# Patient Record
Sex: Male | Born: 1995
Health system: Southern US, Community
[De-identification: ages and names within clinical notes are randomized; demographics above are authoritative.]

## PROBLEM LIST (undated history)

## (undated) DIAGNOSIS — F419 Anxiety disorder, unspecified: Secondary | ICD-10-CM

## (undated) HISTORY — DX: Anxiety disorder, unspecified: F41.9

---

## 2017-07-06 ENCOUNTER — Inpatient Hospital Stay (HOSPITAL_COMMUNITY)
Admission: EM | Admit: 2017-07-06 | Discharge: 2017-07-08 | DRG: 076 | Discharge status: Against Medical Advice | Payer: BLUE CROSS/BLUE SHIELD | Attending: Internal Medicine | Admitting: Internal Medicine

## 2017-07-06 ENCOUNTER — Emergency Department (HOSPITAL_COMMUNITY): Payer: BLUE CROSS/BLUE SHIELD

## 2017-07-06 ENCOUNTER — Encounter (HOSPITAL_COMMUNITY): Payer: Self-pay | Admitting: Emergency Medicine

## 2017-07-06 ENCOUNTER — Other Ambulatory Visit: Payer: Self-pay

## 2017-07-06 DIAGNOSIS — A879 Viral meningitis, unspecified: Secondary | ICD-10-CM | POA: Diagnosis not present

## 2017-07-06 DIAGNOSIS — G039 Meningitis, unspecified: Secondary | ICD-10-CM | POA: Diagnosis not present

## 2017-07-06 DIAGNOSIS — Z87891 Personal history of nicotine dependence: Secondary | ICD-10-CM

## 2017-07-06 DIAGNOSIS — R112 Nausea with vomiting, unspecified: Secondary | ICD-10-CM | POA: Diagnosis not present

## 2017-07-06 DIAGNOSIS — R001 Bradycardia, unspecified: Secondary | ICD-10-CM | POA: Diagnosis present

## 2017-07-06 DIAGNOSIS — M542 Cervicalgia: Secondary | ICD-10-CM | POA: Diagnosis not present

## 2017-07-06 DIAGNOSIS — R079 Chest pain, unspecified: Secondary | ICD-10-CM | POA: Diagnosis not present

## 2017-07-06 DIAGNOSIS — R51 Headache: Secondary | ICD-10-CM | POA: Diagnosis not present

## 2017-07-06 DIAGNOSIS — M545 Low back pain: Secondary | ICD-10-CM | POA: Diagnosis not present

## 2017-07-06 LAB — CBC WITH DIFFERENTIAL/PLATELET
BASOS PCT: 1 %
Basophils Absolute: 0 10*3/uL (ref 0.0–0.1)
Eosinophils Absolute: 0.1 10*3/uL (ref 0.0–0.7)
Eosinophils Relative: 2 %
HEMATOCRIT: 43.5 % (ref 39.0–52.0)
Hemoglobin: 14.6 g/dL (ref 13.0–17.0)
Lymphocytes Relative: 23 %
Lymphs Abs: 1.4 10*3/uL (ref 0.7–4.0)
MCH: 29.7 pg (ref 26.0–34.0)
MCHC: 33.6 g/dL (ref 30.0–36.0)
MCV: 88.4 fL (ref 78.0–100.0)
MONO ABS: 0.4 10*3/uL (ref 0.1–1.0)
MONOS PCT: 7 %
NEUTROS ABS: 4.1 10*3/uL (ref 1.7–7.7)
Neutrophils Relative %: 67 %
Platelets: 214 10*3/uL (ref 150–400)
RBC: 4.92 MIL/uL (ref 4.22–5.81)
RDW: 12 % (ref 11.5–15.5)
WBC: 6 10*3/uL (ref 4.0–10.5)

## 2017-07-06 LAB — CSF CELL COUNT WITH DIFFERENTIAL
Eosinophils, CSF: 0 % (ref 0–1)
Eosinophils, CSF: 1 % (ref 0–1)
Lymphs, CSF: 73 % (ref 40–80)
Lymphs, CSF: 84 % — ABNORMAL HIGH (ref 40–80)
Monocyte-Macrophage-Spinal Fluid: 11 % — ABNORMAL LOW (ref 15–45)
Monocyte-Macrophage-Spinal Fluid: 14 % — ABNORMAL LOW (ref 15–45)
Other Cells, CSF: 3
RBC Count, CSF: 243 /mm3 — ABNORMAL HIGH
RBC Count, CSF: 293 /mm3 — ABNORMAL HIGH
Segmented Neutrophils-CSF: 3 % (ref 0–6)
Segmented Neutrophils-CSF: 5 % (ref 0–6)
Tube #: 1
Tube #: 4
WBC, CSF: 158 /mm3 (ref 0–5)
WBC, CSF: 233 /mm3 (ref 0–5)

## 2017-07-06 LAB — BASIC METABOLIC PANEL
Anion gap: 12 (ref 5–15)
BUN: 12 mg/dL (ref 6–20)
CALCIUM: 9.9 mg/dL (ref 8.9–10.3)
CO2: 24 mmol/L (ref 22–32)
CREATININE: 1.33 mg/dL — AB (ref 0.61–1.24)
Chloride: 103 mmol/L (ref 101–111)
GFR calc Af Amer: >60 mL/min (ref >60)
GFR calc non Af Amer: >60 mL/min (ref >60)
Glucose, Bld: 91 mg/dL (ref 65–99)
Potassium: 3.7 mmol/L (ref 3.5–5.1)
Sodium: 139 mmol/L (ref 135–145)

## 2017-07-06 LAB — CK: Total CK: 338 U/L (ref 49–397)

## 2017-07-06 LAB — PROTEIN AND GLUCOSE, CSF
Glucose, CSF: 50 mg/dL (ref 40–70)
Total  Protein, CSF: 68 mg/dL — ABNORMAL HIGH (ref 15–45)

## 2017-07-06 MED ORDER — ACYCLOVIR SODIUM 50 MG/ML IV SOLN
INTRAVENOUS | Status: AC
  Filled 2017-07-06: qty 10

## 2017-07-06 MED ORDER — HYDROMORPHONE HCL 1 MG/ML IJ SOLN
1.0000 mg | Freq: Once | INTRAMUSCULAR | Status: AC
  Administered 2017-07-06: 1 mg via INTRAVENOUS
  Filled 2017-07-06: qty 1

## 2017-07-06 MED ORDER — KETOROLAC TROMETHAMINE 30 MG/ML IJ SOLN
15.0000 mg | Freq: Once | INTRAMUSCULAR | Status: DC
  Filled 2017-07-06: qty 1

## 2017-07-06 MED ORDER — SODIUM CHLORIDE 0.9 % IV SOLN: 1.0000 g | Freq: Once | INTRAVENOUS | Status: DC

## 2017-07-06 MED ORDER — HYDROMORPHONE HCL 1 MG/ML IJ SOLN
0.5000 mg | Freq: Once | INTRAMUSCULAR | Status: AC
  Administered 2017-07-06: 0.5 mg via INTRAVENOUS
  Filled 2017-07-06: qty 1

## 2017-07-06 MED ORDER — LIDOCAINE HCL (PF) 1 % IJ SOLN
5.0000 mL | Freq: Once | INTRAMUSCULAR | Status: DC
  Filled 2017-07-06: qty 6

## 2017-07-06 MED ORDER — SODIUM CHLORIDE 0.9 % IV SOLN
2.0000 g | Freq: Once | INTRAVENOUS | Status: AC
  Administered 2017-07-07: 2 g via INTRAVENOUS
  Filled 2017-07-06: qty 20

## 2017-07-06 MED ORDER — METOCLOPRAMIDE HCL 5 MG/ML IJ SOLN
10.0000 mg | Freq: Once | INTRAMUSCULAR | Status: AC
  Administered 2017-07-06: 10 mg via INTRAVENOUS
  Filled 2017-07-06: qty 2

## 2017-07-06 MED ORDER — VANCOMYCIN HCL IN DEXTROSE 1-5 GM/200ML-% IV SOLN
1000.0000 mg | Freq: Once | INTRAVENOUS | Status: AC
  Administered 2017-07-06: 1000 mg via INTRAVENOUS
  Filled 2017-07-06: qty 200

## 2017-07-06 MED ORDER — ACYCLOVIR SODIUM 50 MG/ML IV SOLN
10.0000 mg/kg | Freq: Once | INTRAVENOUS | Status: AC
  Administered 2017-07-07: 730 mg via INTRAVENOUS
  Filled 2017-07-06: qty 14.6

## 2017-07-06 MED ORDER — SODIUM CHLORIDE 0.9 % IV BOLUS
1000.0000 mL | Freq: Once | INTRAVENOUS | Status: AC
Start: 2017-07-06 — End: 2017-07-06
  Administered 2017-07-06: 1000 mL via INTRAVENOUS

## 2017-07-06 MED ORDER — LORAZEPAM 2 MG/ML IJ SOLN
1.0000 mg | Freq: Once | INTRAMUSCULAR | Status: AC
  Administered 2017-07-06: 1 mg via INTRAVENOUS
  Filled 2017-07-06: qty 1

## 2017-07-06 NOTE — ED Provider Notes (Addendum)
Medical screening examination/treatment/procedure(s) were conducted as a shared visit with non-physician practitioner(s) and myself.  I personally evaluated the patient during the encounter.  None  21yM with HA and neck/back pain. Concerned he has meningitis. Nuchal rigidity on exam. Photophobia. Neuro exam nonfocal. Says woke-up with HA` and progressing through out day. Not thunderclap. Will CT but suspicion for [facility] fairly low. Labs, LP, symptomatic tx.   Abnormal LP results. Could be traumatic tap. WBC count is high relative to the amount of RBC present though which is concerning and counts didn't change much between tubes #1 and #4.   I suspect this is viral meningitis.  Consider SAH, but I still have a low suspicion. CT head normal. Done after 6 hours of symptom onset but CT is still very sensitive within 24 hours. RBC counts in [facility] typically much, much higher (1,000-100,000+).  Will cover for both HSV and bacterial meningitis at this point until have culture/PCR data.   .Lumbar Puncture Date/Time: 07/06/2017 9:00 PM Performed by: Virgel Manifold, MD Authorized by: Virgel Manifold, MD   Consent:    Consent obtained:  Verbal and emergent situation   Consent given by:  Patient   Risks discussed:  Bleeding, headache, pain and infection Pre-procedure details:    Procedure purpose:  Diagnostic   Preparation: Patient was prepped and draped in usual sterile fashion   Anesthesia (see MAR for exact dosages):    Anesthesia method:  Local infiltration   Local anesthetic:  Lidocaine 1% w/o epi Procedure details:    Lumbar space:  L3-L4 interspace   Patient position:  Sitting   Needle gauge:  20   Needle type:  Spinal needle - Quincke tip   Needle length (in):  3.5   Ultrasound guidance: no     Number of attempts:  1 (one attempt by myself after attempt by Evalee Jefferson, PA)   Tubes of fluid:  4   Total volume (ml):  5 Post-procedure:    Puncture site:  Adhesive bandage applied   Patient  tolerance of procedure:  Tolerated well, no immediate complications       Virgel Manifold, MD 07/06/17 2327

## 2017-07-06 NOTE — ED Notes (Signed)
Pt on monitor  HR from 80 down to 48 at different intervals

## 2017-07-06 NOTE — ED Notes (Signed)
Dr Lama in to assess 

## 2017-07-06 NOTE — ED Notes (Signed)
AC contacted for Rocephin and Acyclovir.

## 2017-07-06 NOTE — ED Notes (Signed)
Pt blocking from 80 to 40   Family at bedside

## 2017-07-06 NOTE — ED Provider Notes (Signed)
[facility] EMERGENCY DEPARTMENT Provider Note   CSN: [id number] Arrival date & time: 07/06/17  1726     History   Chief Complaint Chief Complaint  Patient presents with  . Extremity Pain    back, neck , arm    HPI Andrew Waller is a 22 y.o. male with no significant past medical history reporting he spent the past 2 days moving personal belongings into a storage unit, then woke up this morning with severe pain through his lower back radiating up into his neck region with severe headache which is been worsening as the day has progressed.  Pain is worsened with movement, especially with neck flexion, reporting feeling pain all the way to his lower back with this movement.  He reports feeling warm earlier today, no documented fevers.  He has taken ibuprofen earlier today without improvement in his pain.  He denies weakness or pain in his extremities.  He had one episode of emesis early this morning and has had loss of appetite with no other p.o. intake today.  He denies dysuria, abdominal pain, any further nausea or vomiting.  He does reports photophobia.  He denies rash, denies any tick bites or exposures to others with illness.  The history is provided by the patient and a friend.    History reviewed. No pertinent past medical history.  There are no active problems to display for this patient.   History reviewed. No pertinent surgical history.      Home Medications    Prior to Admission medications   Not on File    Family History No family history on file.  Social History Social History   Tobacco Use  . Smoking status: Former Research scientist (life sciences)  . Smokeless tobacco: Former Network engineer Use Topics  . Alcohol use: Not Currently  . Drug use: Yes    Types: Marijuana     Allergies   Patient has no known allergies.   Review of Systems Review of Systems  Constitutional: Positive for appetite change and fever.  HENT: Negative.  Negative for congestion and sore throat.     Eyes: Negative.   Respiratory: Negative.  Negative for chest tightness and shortness of breath.   Cardiovascular: Negative.  Negative for chest pain.  Gastrointestinal: Positive for nausea and vomiting. Negative for abdominal pain, constipation and diarrhea.  Genitourinary: Negative.   Musculoskeletal: Positive for back pain, neck pain and neck stiffness. Negative for arthralgias and joint swelling.  Skin: Negative.  Negative for color change, rash and wound.  Neurological: Negative for dizziness, weakness, light-headedness, numbness and headaches.  Psychiatric/Behavioral: Negative.      Physical Exam Updated Vital Signs BP (!) 157/90   Pulse (!) 59   Temp 99.2 F (37.3 C) (Oral)   Resp 18   Ht 5\' 6"  (1.676 m)   Wt 73 kg (161 lb)   SpO2 100%   BMI 25.99 kg/m   Physical Exam  Constitutional: He appears well-developed and well-nourished.  HENT:  Head: Normocephalic and atraumatic.  Eyes: Conjunctivae are normal.  Neck: Kernig's sign noted.  nuchal rigidity  Cardiovascular: Normal rate, regular rhythm, normal heart sounds and intact distal pulses.  Pulmonary/Chest: Effort normal and breath sounds normal. He has no wheezes.  Abdominal: Soft. Bowel sounds are normal. There is no tenderness.  Musculoskeletal: Normal range of motion.  Lymphadenopathy:    He has no cervical adenopathy.  Neurological: He is alert.  Skin: Skin is warm and dry.  Psychiatric: He has a normal mood  and affect.  Nursing note and vitals reviewed.    ED Treatments / Results  Labs (all labs ordered are listed, but only abnormal results are displayed) Labs Reviewed  BASIC METABOLIC PANEL - Abnormal; Notable for the following components:      Result Value   Creatinine, Ser 1.33 (*)    All other components within normal limits  CSF CELL COUNT WITH DIFFERENTIAL - Abnormal; Notable for the following components:   RBC Count, CSF 243 (*)    WBC, CSF 158 (*)    Lymphs, CSF 84 (*)     Monocyte-Macrophage-Spinal Fluid 11 (*)    All other components within normal limits  CSF CELL COUNT WITH DIFFERENTIAL - Abnormal; Notable for the following components:   RBC Count, CSF 293 (*)    WBC, CSF 233 (*)    Monocyte-Macrophage-Spinal Fluid 14 (*)    All other components within normal limits  PROTEIN AND GLUCOSE, CSF - Abnormal; Notable for the following components:   Total  Protein, CSF 68 (*)    All other components within normal limits  CSF CULTURE  CULTURE, BLOOD (ROUTINE X 2)  CULTURE, BLOOD (ROUTINE X 2)  CBC WITH DIFFERENTIAL/PLATELET  CK  URINALYSIS, ROUTINE W REFLEX MICROSCOPIC  HIV ANTIBODY (ROUTINE TESTING)  HERPES SIMPLEX VIRUS(HSV) DNA BY PCR  HSV(HERPES SMPLX VRS)ABS-I+II(IGG)-CSF  PATHOLOGIST SMEAR REVIEW  ENTEROVIRUS PCR    EKG None  Radiology Ct Head Wo Contrast  Result Date: 07/06/2017 CLINICAL DATA:  22 y/o M; worse headache of life upon waking, light sensitivity. EXAM: CT HEAD WITHOUT CONTRAST TECHNIQUE: Contiguous axial images were obtained from the base of the skull through the vertex without intravenous contrast. COMPARISON:  None. FINDINGS: Brain: No evidence of acute infarction, hemorrhage, hydrocephalus, extra-axial collection or mass lesion/mass effect. Vascular: No hyperdense vessel or unexpected calcification. Skull: Normal. Negative for fracture or focal lesion. Sinuses/Orbits: Moderate right maxillary, mild anterior ethmoid, and mild frontal sinus mucosal thickening. Normal aeration of mastoid air cells. Orbits are unremarkable. Other: None. IMPRESSION: 1. No acute intracranial abnormality identified. Unremarkable CT of the brain. 2. Frontal, anterior ethmoid, and right maxillary sinus disease. Electronically Signed   By: Kristine Garbe M.D.   On: 07/06/2017 19:28    Procedures Procedures (including critical care time)  Attempt x 2 to perform LP using sterile procedures.  No success.  Deferred to Dr. Wilson Singer.   Medications Ordered in  ED Medications  lidocaine (PF) (XYLOCAINE) 1 % injection 5 mL (has no administration in time range)  vancomycin (VANCOCIN) IVPB 1000 mg/200 mL premix (1,000 mg Intravenous New Bag/Given 07/06/17 2245)  acyclovir (ZOVIRAX) 730 mg in dextrose 5 % 150 mL IVPB (has no administration in time range)  cefTRIAXone (ROCEPHIN) 2 g in sodium chloride 0.9 % 100 mL IVPB (has no administration in time range)  sodium chloride 0.9 % bolus 1,000 mL (1,000 mLs Intravenous New Bag/Given 07/06/17 1852)  HYDROmorphone (DILAUDID) injection 1 mg (1 mg Intravenous Given 07/06/17 1932)  LORazepam (ATIVAN) injection 1 mg (1 mg Intravenous Given 07/06/17 1930)  metoCLOPramide (REGLAN) injection 10 mg (10 mg Intravenous Given 07/06/17 1925)  HYDROmorphone (DILAUDID) injection 0.5 mg (0.5 mg Intravenous Given 07/06/17 2119)     Initial Impression / Assessment and Plan / ED Course  I have reviewed the triage vital signs and the nursing notes.  Pertinent labs & imaging results that were available during my care of the patient were reviewed by me and considered in my medical decision making (see chart for details).  Pt with sx and labs suggesting meningitis, probably viral etiology, cultures pending.  He was started on vancomycin, rocephin and acyclovir to cover for both viral and bacterial possibilities.  Pt will need admission for further tx. Discussed with Dr. Darrick Meigs who will admit patient.  Final Clinical Impressions(s) / ED Diagnoses   Final diagnoses:  Meningitis    ED Discharge Orders    None       Landis Martins 07/06/17 2302    Virgel Manifold, MD 07/09/17 1257

## 2017-07-06 NOTE — ED Triage Notes (Signed)
Moved "stuff" all day yesterday   Today difficulty moving and walking   Here by POV

## 2017-07-06 NOTE — ED Notes (Signed)
Patient transported to CT 

## 2017-07-06 NOTE — ED Notes (Signed)
Critical results on CSF #1 158 and #4 233 given to Dr. Wilson Singer.

## 2017-07-06 NOTE — ED Notes (Signed)
report from [place], [place]

## 2017-07-06 NOTE — ED Notes (Signed)
EDPa in to see patient for initial assessment.

## 2017-07-06 NOTE — ED Notes (Signed)
Called AC for meds.  

## 2017-07-07 ENCOUNTER — Encounter (HOSPITAL_COMMUNITY): Payer: Self-pay | Admitting: Emergency Medicine

## 2017-07-07 ENCOUNTER — Inpatient Hospital Stay (HOSPITAL_COMMUNITY): Payer: BLUE CROSS/BLUE SHIELD

## 2017-07-07 DIAGNOSIS — R001 Bradycardia, unspecified: Secondary | ICD-10-CM

## 2017-07-07 DIAGNOSIS — A879 Viral meningitis, unspecified: Secondary | ICD-10-CM | POA: Diagnosis present

## 2017-07-07 DIAGNOSIS — G039 Meningitis, unspecified: Secondary | ICD-10-CM | POA: Diagnosis not present

## 2017-07-07 DIAGNOSIS — Z87891 Personal history of nicotine dependence: Secondary | ICD-10-CM | POA: Diagnosis not present

## 2017-07-07 DIAGNOSIS — M542 Cervicalgia: Secondary | ICD-10-CM | POA: Diagnosis present

## 2017-07-07 LAB — CBC
HCT: 39.8 % (ref 39.0–52.0)
Hemoglobin: 13.2 g/dL (ref 13.0–17.0)
MCH: 29.7 pg (ref 26.0–34.0)
MCHC: 33.2 g/dL (ref 30.0–36.0)
MCV: 89.4 fL (ref 78.0–100.0)
Platelets: 184 10*3/uL (ref 150–400)
RBC: 4.45 MIL/uL (ref 4.22–5.81)
RDW: 11.8 % (ref 11.5–15.5)
WBC: 6.4 10*3/uL (ref 4.0–10.5)

## 2017-07-07 LAB — URINALYSIS, ROUTINE W REFLEX MICROSCOPIC
Bilirubin Urine: NEGATIVE
GLUCOSE, UA: NEGATIVE mg/dL
Hgb urine dipstick: NEGATIVE
Ketones, ur: NEGATIVE mg/dL
LEUKOCYTES UA: NEGATIVE
NITRITE: NEGATIVE
Protein, ur: NEGATIVE mg/dL
SPECIFIC GRAVITY, URINE: 1.006 (ref 1.005–1.030)
pH: 7 (ref 5.0–8.0)

## 2017-07-07 LAB — COMPREHENSIVE METABOLIC PANEL
ALBUMIN: 3.9 g/dL (ref 3.5–5.0)
ALT: 14 U/L — ABNORMAL LOW (ref 17–63)
ANION GAP: 7 (ref 5–15)
AST: 18 U/L (ref 15–41)
Alkaline Phosphatase: 75 U/L (ref 38–126)
BUN: 9 mg/dL (ref 6–20)
CO2: 25 mmol/L (ref 22–32)
Calcium: 9.1 mg/dL (ref 8.9–10.3)
Chloride: 105 mmol/L (ref 101–111)
Creatinine, Ser: 1.18 mg/dL (ref 0.61–1.24)
GFR calc Af Amer: >60 mL/min (ref >60)
GFR calc non Af Amer: >60 mL/min (ref >60)
GLUCOSE: 126 mg/dL — AB (ref 65–99)
POTASSIUM: 4.3 mmol/L (ref 3.5–5.1)
SODIUM: 137 mmol/L (ref 135–145)
Total Bilirubin: 0.7 mg/dL (ref 0.3–1.2)
Total Protein: 7.1 g/dL (ref 6.5–8.1)

## 2017-07-07 LAB — SEDIMENTATION RATE: Sed Rate: 3 mm/hr (ref 0–16)

## 2017-07-07 LAB — ECHOCARDIOGRAM COMPLETE
HEIGHTINCHES: 70 in
Weight: 2592.61 oz

## 2017-07-07 LAB — TROPONIN I
Troponin I: <0.03 ng/mL (ref <0.03)
Troponin I: <0.03 ng/mL (ref <0.03)

## 2017-07-07 LAB — CK TOTAL AND CKMB (NOT AT ARMC)

## 2017-07-07 LAB — MRSA PCR SCREENING: MRSA BY PCR: NEGATIVE

## 2017-07-07 MED ORDER — ACETAMINOPHEN 325 MG PO TABS
650.0000 mg | ORAL_TABLET | Freq: Four times a day (QID) | ORAL | Status: DC | PRN
  Administered 2017-07-07 (×2): 650 mg via ORAL
  Filled 2017-07-07 (×2): qty 2

## 2017-07-07 MED ORDER — SODIUM CHLORIDE 0.9 % IV SOLN
2.0000 g | INTRAVENOUS | Status: DC
  Filled 2017-07-07: qty 20

## 2017-07-07 MED ORDER — DEXAMETHASONE SODIUM PHOSPHATE 10 MG/ML IJ SOLN
10.0000 mg | Freq: Once | INTRAMUSCULAR | Status: AC
  Administered 2017-07-07: 10 mg via INTRAVENOUS
  Filled 2017-07-07: qty 1

## 2017-07-07 MED ORDER — ONDANSETRON HCL 4 MG/2ML IJ SOLN
4.0000 mg | Freq: Four times a day (QID) | INTRAMUSCULAR | Status: DC | PRN
  Administered 2017-07-07 – 2017-07-08 (×2): 4 mg via INTRAVENOUS
  Filled 2017-07-07 (×2): qty 2

## 2017-07-07 MED ORDER — HYDROMORPHONE HCL 1 MG/ML IJ SOLN
1.0000 mg | INTRAMUSCULAR | Status: DC | PRN
  Administered 2017-07-07 – 2017-07-08 (×8): 1 mg via INTRAVENOUS
  Filled 2017-07-07 (×9): qty 1

## 2017-07-07 MED ORDER — DEXTROSE 5 % IV SOLN
10.0000 mg/kg | Freq: Three times a day (TID) | INTRAVENOUS | Status: DC
  Administered 2017-07-07 – 2017-07-08 (×4): 735 mg via INTRAVENOUS
  Filled 2017-07-07 (×7): qty 14.7

## 2017-07-07 MED ORDER — SODIUM CHLORIDE 0.9 % IV SOLN
2.0000 g | Freq: Two times a day (BID) | INTRAVENOUS | Status: DC
  Administered 2017-07-07 – 2017-07-08 (×3): 2 g via INTRAVENOUS
  Filled 2017-07-07 (×3): qty 20
  Filled 2017-07-07 (×2): qty 2

## 2017-07-07 MED ORDER — ACETAMINOPHEN 650 MG RE SUPP
650.0000 mg | Freq: Four times a day (QID) | RECTAL | Status: DC | PRN
Start: 2017-07-07 — End: 2017-07-08

## 2017-07-07 MED ORDER — ONDANSETRON HCL 4 MG PO TABS: 4.0000 mg | ORAL_TABLET | Freq: Four times a day (QID) | ORAL | Status: DC | PRN

## 2017-07-07 MED ORDER — GUAIFENESIN-DM 100-10 MG/5ML PO SYRP
5.0000 mL | ORAL_SOLUTION | ORAL | Status: DC | PRN
  Administered 2017-07-07 – 2017-07-08 (×4): 5 mL via ORAL
  Filled 2017-07-07 (×4): qty 5

## 2017-07-07 MED ORDER — VANCOMYCIN HCL IN DEXTROSE 1-5 GM/200ML-% IV SOLN
1000.0000 mg | Freq: Three times a day (TID) | INTRAVENOUS | Status: DC
  Administered 2017-07-07 – 2017-07-08 (×5): 1000 mg via INTRAVENOUS
  Filled 2017-07-07 (×5): qty 200

## 2017-07-07 MED ORDER — SODIUM CHLORIDE 0.9 % IV SOLN: 2.0000 g | INTRAVENOUS | Status: DC

## 2017-07-07 MED ORDER — SODIUM CHLORIDE 0.9 % IV SOLN
INTRAVENOUS | Status: DC
  Administered 2017-07-07 – 2017-07-08 (×3): via INTRAVENOUS

## 2017-07-07 NOTE — ED Notes (Signed)
Report to Shannon, RN  

## 2017-07-07 NOTE — Progress Notes (Signed)
*  PRELIMINARY RESULTS* Echocardiogram 2D Echocardiogram has been performed.  Leavy Cella 07/07/2017, 4:09 PM

## 2017-07-07 NOTE — Progress Notes (Signed)
Patient seen and examined.  Admitted after midnight secondary to fever, headache, photophobia, neck pain.  Symptoms present for the last 2 days prior to admission and worsening.  LP suggesting gram positive cocci; final results/sensitivity pending. Patient with low grade temp. AAOX3 now.  Please refer to H&P written by Dr. Darrick Meigs for further info/details on admission.  Plan: -Continue empiric acyclovir, vancomycin and Rocephin -Continue as needed antipyretics/analgesics -Oriented x3 and no longer lethargic; will advance diet. -follow clinical response.  Barton Dubois MD [phone]

## 2017-07-07 NOTE — Progress Notes (Signed)
Pharmacy Antibiotic Note  Lin Glazier is a 22 y.o. male admitted on 07/06/2017 with meningitis.  Pharmacy has been consulted for Vancomycin and acyclovir dosing to cover both bacterial and viral meningitis.  Plan: Vancomycin 1000 mg IV every 8 hours.  Goal trough 15-20 mcg/mL.  Ceftriaxone 2000 mg IV every 12 hours Acyclovir 735 mg (10 mg/kg) every 8 hours Mointor labs, c/s, and vanco trough as indicated  Height: 5\' 10"  (177.8 cm) Weight: 162 lb 0.6 oz (73.5 kg) IBW/kg (Calculated) : 73  Temp (24hrs), Avg:99.4 F (37.4 C), Min:98.4 F (36.9 C), Max:100.9 F (38.3 C)  Recent Labs  Lab 07/06/17 1856 07/07/17 0724  WBC 6.0 6.4  CREATININE 1.33* 1.18    Estimated Creatinine Clearance: 100.5 mL/min (by C-G formula based on SCr of 1.18 mg/dL).    No Known Allergies  Antimicrobials this admission: Vanco 6/3 >>  Ceftriaxone 6/3 >>  Acyclovir 6/3 >>  Dose adjustments this admission: N/A  Microbiology results: 6/3 BCx: NG x 12 hours 6/3 CSF culture: pending 6/3 MRSA PCR: negative  Thank you for allowing pharmacy to be a part of this patient's care.  Ramond Craver 07/07/2017 8:13 AM

## 2017-07-07 NOTE — ED Notes (Signed)
Lab in to draw

## 2017-07-07 NOTE — H&P (Signed)
TR[facility] H&P    Patient Demographics:    Andrew Waller, is a 22 y.o. male  MRN: [id number]  DOB - 1995-07-31  Admit Date - 07/06/2017  Referring MD/NP/PA: Dr. Wilson Singer  Outpatient Primary MD for the patient is Patient, No Pcp Per  Patient coming from: Home  Chief complaint-neck pain   HPI:    Andrew Waller  is a 22 y.o. male, with no significant medical problems came to hospital with chief complaints of back and neck pain.  Patient says that he spent past 2 days moving personal belongings into storage unit then woke up the morning with severe pain in the lower back radiating up to his neck region with severe headache which has been getting worse.  Pain was worse on next flexion.  He took ibuprofen earlier today without improvement in the pain. He had one episode of vomiting this morning.  Denies chest pain or shortness of breath.  Also complains of photophobia. In the ED lumbar puncture was done, which showed glucose 50, RBC 243, WBC 233, total protein 68, lymphocytes 84. patient empirically started on acyclovir, vancomycin and ceftriaxone to cover both for viral and bacterial meningitis.   He denies dysuria No previous history of stroke or seizures.   Review of systems:      All other systems reviewed and are negative.   With Past History of the following :    History reviewed. No pertinent past medical history.    History reviewed. No pertinent surgical history.    Social History:      Social History   Tobacco Use  . Smoking status: Former Research scientist (life sciences)  . Smokeless tobacco: Former Network engineer Use Topics  . Alcohol use: Not Currently       Family History :   Noncontributory   Home Medications:   Prior to Admission medications   Not on File     Allergies:    No Known Allergies   Physical Exam:   Vitals  Blood pressure 130/78, pulse (!) 53, temperature (!) 100.9 F (38.3 C),  temperature source Oral, resp. rate (!) 22, height 5\' 6"  (1.676 m), weight 73 kg (161 lb), SpO2 100 %.  1.  General: Appears lethargic  2. Psychiatric:  Intact judgement and  insight, awake alert, oriented x 3.  3. Neurologic: No focal neurological deficits, all cranial nerves intact.Strength 5/5 all 4 extremities, sensation intact all 4 extremities, plantars down going.  4. Eyes :  anicteric sclerae, moist conjunctivae with no lid lag. PERRLA.  5. ENMT:  Oropharynx clear with moist mucous membranes and good dentition  6. Neck:  Neck stiffness noted,   7. Respiratory : Normal respiratory effort, good air movement bilaterally,clear to  auscultation bilaterally  8. Cardiovascular : RRR, no gallops, rubs or murmurs, no leg edema  9. Gastrointestinal:  Positive bowel sounds, abdomen soft, non-tender to palpation,no hepatosplenomegaly, no rigidity or guarding       10. Skin:  No cyanosis, normal texture and turgor, no rash, lesions or ulcers  11.Musculoskeletal:  Good muscle tone,  joints appear normal , no effusions,  normal range of motion    Data Review:    CBC Recent Labs  Lab 07/06/17 1856  WBC 6.0  HGB 14.6  HCT 43.5  PLT 214  MCV 88.4  MCH 29.7  MCHC 33.6  RDW 12.0  LYMPHSABS 1.4  MONOABS 0.4  EOSABS 0.1  BASOSABS 0.0   ------------------------------------------------------------------------------------------------------------------  Chemistries  Recent Labs  Lab 07/06/17 1856  NA 139  K 3.7  CL 103  CO2 24  GLUCOSE 91  BUN 12  CREATININE 1.33*  CALCIUM 9.9   ------------------------------------------------------------------------------------------------------------------  ------------------------------------------------------------------------------------------------------------------ GFR: Estimated Creatinine Clearance: 78 mL/min (A) (by C-G formula based on SCr of 1.33 mg/dL (H)). Liver Function Tests: No results for input(s): AST, ALT,  ALKPHOS, BILITOT, PROT, ALBUMIN in the last 168 hours. No results for input(s): LIPASE, AMYLASE in the last 168 hours. No results for input(s): AMMONIA in the last 168 hours. Coagulation Profile: No results for input(s): INR, PROTIME in the last 168 hours. Cardiac Enzymes: Recent Labs  Lab 07/06/17 1856  CKTOTAL 338   BNP (last 3 results) No results for input(s): PROBNP in the last 8760 hours. HbA1C: No results for input(s): HGBA1C in the last 72 hours. CBG: No results for input(s): GLUCAP in the last 168 hours. Lipid Profile: No results for input(s): CHOL, HDL, LDLCALC, TRIG, CHOLHDL, LDLDIRECT in the last 72 hours. Thyroid Function Tests: No results for input(s): TSH, T4TOTAL, FREET4, T3FREE, THYROIDAB in the last 72 hours. Anemia Panel: No results for input(s): VITAMINB12, FOLATE, FERRITIN, TIBC, IRON, RETICCTPCT in the last 72 hours.  --------------------------------------------------------------------------------------------------------------- Urine analysis: No results found for: COLORURINE, APPEARANCEUR, LABSPEC, PHURINE, GLUCOSEU, HGBUR, BILIRUBINUR, KETONESUR, PROTEINUR, UROBILINOGEN, NITRITE, LEUKOCYTESUR    Imaging Results:    Ct Head Wo Contrast  Result Date: 07/06/2017 CLINICAL DATA:  22 y/o M; worse headache of life upon waking, light sensitivity. EXAM: CT HEAD WITHOUT CONTRAST TECHNIQUE: Contiguous axial images were obtained from the base of the skull through the vertex without intravenous contrast. COMPARISON:  None. FINDINGS: Brain: No evidence of acute infarction, hemorrhage, hydrocephalus, extra-axial collection or mass lesion/mass effect. Vascular: No hyperdense vessel or unexpected calcification. Skull: Normal. Negative for fracture or focal lesion. Sinuses/Orbits: Moderate right maxillary, mild anterior ethmoid, and mild frontal sinus mucosal thickening. Normal aeration of mastoid air cells. Orbits are unremarkable. Other: None. IMPRESSION: 1. No acute  intracranial abnormality identified. Unremarkable CT of the brain. 2. Frontal, anterior ethmoid, and right maxillary sinus disease. Electronically Signed   By: Kristine Garbe M.D.   On: 07/06/2017 19:28      Assessment & Plan:    Active Problems:   Viral meningitis   1. Meningitis-likely viral, patient CSF analysis 0.2 with viral meningitis.  Also cover continue acyclovir,for  bacterial meningitis until cultures are resulted.  Vancomycin per pharmacy consultation, ceftriaxone 2 g IV daily.  Will give 1 dose of Decadron 10 mg IV x1.  Follow CSF culture results.  PCR  HSV, enterovirus obtained.  Neurochecks every 4 hours.  2. Bradycardia-patient noted bradycardia in the ED, heart rate down to 40s.  Concern for?  Myocarditis will check troponin every 6 hours x3, echocardiogram, CK-MB, will obtain EKG stat.  Monitor patient closely in stepdown unit.  Obtain chest x-ray   DVT Prophylaxis-   SCDs   AM Labs Ordered, also please review Full Orders  Family Communication: Admission, patients condition and plan of care including tests being ordered have been discussed with the patient and his mother at  bedside who indicate understanding and agree with the plan and Code Status.  Code Status: Full code  Admission status: Inpatient  Time spent in minutes : 60 minutes   Oswald Hillock M.D on 07/07/2017 at 12:25 AM  Between 7am to 7pm - Pager - [phone]. After 7pm go to [email or web address] - password [facility]  [facility] - Office  [phone]

## 2017-07-07 NOTE — Progress Notes (Signed)
Patient told NT that he wanted to sign himself out of the hospital. I went in and discussed this with patient to see why he wanted to leave and what we could do to make him more comfortable.   Patient stated that there were outside stressors that were making him want to leave. This nurse explained the importance of IV antibiotic therapy for his meningitis and the risks he would take by leaving. Patient verbalized understanding of this and decided he would stay. Dr. Dyann Kief notified of all of this.

## 2017-07-07 NOTE — ED Notes (Signed)
Call to [facility], RN who will receive pt  She is going to purus chart and call when she has finished

## 2017-07-08 LAB — CK TOTAL AND CKMB (NOT AT ARMC)
CK, MB: 1.3 ng/mL (ref 0.5–5.0)
RELATIVE INDEX: 0.8 (ref 0.0–2.5)
Total CK: 161 U/L (ref 49–397)

## 2017-07-08 LAB — BASIC METABOLIC PANEL
ANION GAP: 6 (ref 5–15)
BUN: 10 mg/dL (ref 6–20)
CALCIUM: 8.7 mg/dL — AB (ref 8.9–10.3)
CO2: 27 mmol/L (ref 22–32)
CREATININE: 1.03 mg/dL (ref 0.61–1.24)
Chloride: 106 mmol/L (ref 101–111)
GFR calc Af Amer: >60 mL/min (ref >60)
GFR calc non Af Amer: >60 mL/min (ref >60)
GLUCOSE: 94 mg/dL (ref 65–99)
Potassium: 3.8 mmol/L (ref 3.5–5.1)
Sodium: 139 mmol/L (ref 135–145)

## 2017-07-08 LAB — PATHOLOGIST SMEAR REVIEW

## 2017-07-08 LAB — CBC
HEMATOCRIT: 36.9 % — AB (ref 39.0–52.0)
Hemoglobin: 12.1 g/dL — ABNORMAL LOW (ref 13.0–17.0)
MCH: 29.2 pg (ref 26.0–34.0)
MCHC: 32.8 g/dL (ref 30.0–36.0)
MCV: 89.1 fL (ref 78.0–100.0)
PLATELETS: 178 10*3/uL (ref 150–400)
RBC: 4.14 MIL/uL — ABNORMAL LOW (ref 4.22–5.81)
RDW: 11.9 % (ref 11.5–15.5)
WBC: 7.3 10*3/uL (ref 4.0–10.5)

## 2017-07-08 LAB — HIV ANTIBODY (ROUTINE TESTING W REFLEX): HIV Screen 4th Generation wRfx: NONREACTIVE

## 2017-07-08 LAB — VANCOMYCIN, TROUGH: VANCOMYCIN TR: 18 ug/mL (ref 15–20)

## 2017-07-08 MED ORDER — PANTOPRAZOLE SODIUM 40 MG PO TBEC
40.0000 mg | DELAYED_RELEASE_TABLET | Freq: Every day | ORAL | Status: DC
  Administered 2017-07-08: 40 mg via ORAL
  Filled 2017-07-08: qty 1

## 2017-07-08 MED ORDER — HYDROMORPHONE HCL 1 MG/ML IJ SOLN
1.0000 mg | Freq: Once | INTRAMUSCULAR | Status: AC
  Administered 2017-07-08: 1 mg via INTRAVENOUS
  Filled 2017-07-08: qty 1

## 2017-07-08 MED ORDER — METHOCARBAMOL 1000 MG/10ML IJ SOLN
500.0000 mg | Freq: Three times a day (TID) | INTRAMUSCULAR | Status: DC | PRN
  Administered 2017-07-08: 500 mg via INTRAVENOUS
  Filled 2017-07-08: qty 550

## 2017-07-08 MED ORDER — DEXAMETHASONE 4 MG PO TABS
10.0000 mg | ORAL_TABLET | Freq: Four times a day (QID) | ORAL | Status: DC
  Administered 2017-07-08 (×2): 10 mg via ORAL
  Filled 2017-07-08 (×2): qty 3

## 2017-07-08 MED ORDER — HYDROMORPHONE HCL 1 MG/ML IJ SOLN
1.0000 mg | Freq: Four times a day (QID) | INTRAMUSCULAR | Status: DC | PRN
  Administered 2017-07-08: 1 mg via INTRAVENOUS

## 2017-07-08 MED ORDER — OXYCODONE HCL 5 MG PO TABS
10.0000 mg | ORAL_TABLET | Freq: Four times a day (QID) | ORAL | Status: DC | PRN
  Administered 2017-07-08: 10 mg via ORAL
  Filled 2017-07-08: qty 2

## 2017-07-08 NOTE — Plan of Care (Signed)
Patient confirmed having knowledge of general education information at this time and denies having any questions or concerns at this time.

## 2017-07-08 NOTE — Progress Notes (Signed)
1602 Patient is med-surg and is transferring to Dept 300 room# 309. Report given to receiving nurse Vista Deck, RN.

## 2017-07-08 NOTE — Progress Notes (Signed)
Pharmacy Antibiotic Note  Karlis Cregg is a 22 y.o. male admitted on 07/06/2017 with meningitis.  Pharmacy consulted for Vancomycin and acyclovir dosing to cover both bacterial and viral meningitis.  Trough @ goal 6/4  Plan: Continue Vancomycin 1000 mg IV every 8 hours.  Goal trough 15-20 mcg/mL.  Ceftriaxone 2000 mg IV every 12 hours Acyclovir 735 mg (10 mg/kg) every 8 hours Mointor labs, c/s, and vanco trough as indicated  Height: 5\' 10"  (177.8 cm) Weight: 162 lb 0.6 oz (73.5 kg) IBW/kg (Calculated) : 73  Temp (24hrs), Avg:98.3 F (36.8 C), Min:97.8 F (36.6 C), Max:98.7 F (37.1 C)  Recent Labs  Lab 07/06/17 1856 07/07/17 0724 07/08/17 0434 07/08/17 1638  WBC 6.0 6.4 7.3  --   CREATININE 1.33* 1.18 1.03  --   VANCOTROUGH  --   --   --  18    Estimated Creatinine Clearance: 115.2 mL/min (by C-G formula based on SCr of 1.03 mg/dL).    No Known Allergies  Antimicrobials this admission: Vanco 6/3 >>  Ceftriaxone 6/3 >>  Acyclovir 6/3 >>  Dose adjustments this admission: N/A  Microbiology results: 6/3 BCx: NG x 2 days 6/3 CSF culture: pending 6/3 MRSA PCR: negative  Thank you for allowing pharmacy to be a part of this patient's care.  Pricilla Larsson 07/08/2017 7:32 PM

## 2017-07-08 NOTE — Progress Notes (Signed)
PROGRESS NOTE    Andrew Waller  SJG:[id number] DOB: 1996/01/25 DOA: 07/06/2017 PCP: Patient, No Pcp Per   Brief Narrative:  22 year old male without past medical history of significance who presented to the emergency department secondary to neck pain, headache, fever and photophobia.    Admitted for further treatment and management of meningitis.  Patient with gram-positive cocci growing in his CSF.   Assessment & Plan: 1-meningitis -Currently afebrile -But is still complaining of headache and neck pain -Continue current analgesic regimen -Continue antibiotics using vancomycin, Rocephin and acyclovir until final results from CSF cultures completed. -Will add Robaxin to assist with neck muscle stiffness and spasm -Patient also started on dexamethasone given gram-positive cocci growing in his CSF. -will follow clinical response and continue supportive care  2-Bradycardia -probably in response from slight increase in his intracranial pressure from meningitis. -vs mild sinus bradycardia as he is young and physically active -no abnormalities seen on echo -Discontinue telemetry. -Heart rate is normal/stable and sinus at this time.  3-GI prophylaxis  -will use Protonix to protect his stomach while receiving steroids.   DVT prophylaxis:  Code Status:  Family Communication:  Disposition Plan:   Consultants:   None  Procedures:   See below for x-ray reports   LP  2-D echo - Left ventricle: The cavity size was normal. Wall thickness was at   the upper limits of normal. Systolic function was normal. The   estimated ejection fraction was in the range of 60% to 65%. Wall   motion was normal; there were no regional wall motion   abnormalities. Left ventricular diastolic function parameters   were normal. - Mitral valve: There was trivial regurgitation. - Right atrium: Central venous pressure (est): 3 mm Hg. - Atrial septum: No defect or patent foramen ovale was identified. -  Tricuspid valve: There was trivial regurgitation. - Pulmonary arteries: Systolic pressure could not be accurately   estimated. - Pericardium, extracardiac: There was no pericardial effusion.   Antimicrobials:  Anti-infectives (From admission, onward)   Start     Dose/Rate Route Frequency Ordered Stop   07/08/17 1100  cefTRIAXone (ROCEPHIN) 2 g in sodium chloride 0.9 % 100 mL IVPB  Status:  Discontinued     2 g 200 mL/hr over 30 Minutes Intravenous Every 24 hours 07/07/17 0134 07/07/17 0135   07/07/17 1200  cefTRIAXone (ROCEPHIN) 2 g in sodium chloride 0.9 % 100 mL IVPB     2 g 200 mL/hr over 30 Minutes Intravenous Every 12 hours 07/07/17 0807     07/07/17 1100  cefTRIAXone (ROCEPHIN) 2 g in sodium chloride 0.9 % 100 mL IVPB  Status:  Discontinued     2 g 200 mL/hr over 30 Minutes Intravenous Every 24 hours 07/07/17 0135 07/07/17 0807   07/07/17 0900  vancomycin (VANCOCIN) IVPB 1000 mg/200 mL premix     1,000 mg 200 mL/hr over 60 Minutes Intravenous Every 8 hours 07/07/17 0755     07/07/17 0900  acyclovir (ZOVIRAX) 735 mg in dextrose 5 % 150 mL IVPB     10 mg/kg  73.5 kg 164.7 mL/hr over 60 Minutes Intravenous Every 8 hours 07/07/17 0805     07/06/17 2245  cefTRIAXone (ROCEPHIN) 2 g in sodium chloride 0.9 % 100 mL IVPB     2 g 200 mL/hr over 30 Minutes Intravenous  Once 07/06/17 2231 07/07/17 0150   07/06/17 2230  vancomycin (VANCOCIN) IVPB 1000 mg/200 mL premix     1,000 mg 200 mL/hr over 60 Minutes  Intravenous  Once 07/06/17 2225 07/07/17 0002   07/06/17 2230  cefTRIAXone (ROCEPHIN) 1 g in sodium chloride 0.9 % 100 mL IVPB  Status:  Discontinued     1 g 200 mL/hr over 30 Minutes Intravenous  Once 07/06/17 2225 07/07/17 0205   07/06/17 2230  acyclovir (ZOVIRAX) 730 mg in dextrose 5 % 150 mL IVPB     10 mg/kg  73 kg 164.6 mL/hr over 60 Minutes Intravenous  Once 07/06/17 2225 07/07/17 0150      Subjective: Afebrile, no chest pain, no shortness of breath.  Still complaining of  significant headaches and neck pain.  Reports anorexia.  Objective: Vitals:   07/08/17 0600 07/08/17 0700 07/08/17 0800 07/08/17 0815  BP: 112/73 (!) 111/54 100/60   Pulse: (!) 52   (!) 59  Resp: 17 13 12 19   Temp:    97.8 F (36.6 C)  TempSrc:      SpO2: 97% 98%  100%  Weight:      Height:        Intake/Output Summary (Last 24 hours) at 07/08/2017 0940 Last data filed at 07/08/2017 0400 Gross per 24 hour  Intake 3604.1 ml  Output 2000 ml  Net 1604.1 ml   Filed Weights   07/06/17 1739 07/07/17 0139 07/07/17 0500  Weight: 73 kg (161 lb) 73.5 kg (162 lb 0.6 oz) 73.5 kg (162 lb 0.6 oz)    Examination: General exam: Alert, awake, oriented x 3; complaining of headache and neck pain.  No nausea, no vomiting, no abdominal pain, no chest pain or shortness of breath. Respiratory system: Clear to auscultation. Respiratory effort normal. Cardiovascular system:RRR. No murmurs, rubs, gallops. Gastrointestinal system: Abdomen is nondistended, soft and nontender. No organomegaly or masses felt. Normal bowel sounds heard. Central nervous system: Alert and oriented. No focal neurological deficits.  Patient reports photophobia. Extremities: No C/C/E, +pedal pulses Skin: No rashes, lesions or ulcers Psychiatry: Judgement and insight appear normal. Mood & affect appropriate.   Data Reviewed: I have personally reviewed following labs and imaging studies  CBC: Recent Labs  Lab 07/06/17 1856 07/07/17 0724 07/08/17 0434  WBC 6.0 6.4 7.3  NEUTROABS 4.1  --   --   HGB 14.6 13.2 12.1*  HCT 43.5 39.8 36.9*  MCV 88.4 89.4 89.1  PLT 214 184 924   Basic Metabolic Panel: Recent Labs  Lab 07/06/17 1856 07/07/17 0724 07/08/17 0434  NA 139 137 139  K 3.7 4.3 3.8  CL 103 105 106  CO2 24 25 27   GLUCOSE 91 126* 94  BUN 12 9 10   CREATININE 1.33* 1.18 1.03  CALCIUM 9.9 9.1 8.7*   GFR: Estimated Creatinine Clearance: 115.2 mL/min (by C-G formula based on SCr of 1.03 mg/dL). Liver Function  Tests: Recent Labs  Lab 07/07/17 0724  AST 18  ALT 14*  ALKPHOS 75  BILITOT 0.7  PROT 7.1  ALBUMIN 3.9   No results for input(s): LIPASE, AMYLASE in the last 168 hours. No results for input(s): AMMONIA in the last 168 hours. Coagulation Profile: No results for input(s): INR, PROTIME in the last 168 hours. Cardiac Enzymes: Recent Labs  Lab 07/06/17 1856 07/07/17 0023 07/07/17 0035 07/07/17 0724 07/07/17 1238 07/07/17 1840  CKTOTAL 338  --  SPECIMEN HEMOLYZED. HEMOLYSIS MAY AFFECT INTEGRITY OF RESULTS.  --   --   --   CKMB  --   --  SPECIMEN HEMOLYZED. HEMOLYSIS MAY AFFECT INTEGRITY OF RESULTS.  --   --   --  TROPONINI  --  <0.03  --  <0.03 <0.03 <0.03   BNP (last 3 results) No results for input(s): PROBNP in the last 8760 hours. HbA1C: No results for input(s): HGBA1C in the last 72 hours. CBG: No results for input(s): GLUCAP in the last 168 hours. Lipid Profile: No results for input(s): CHOL, HDL, LDLCALC, TRIG, CHOLHDL, LDLDIRECT in the last 72 hours. Thyroid Function Tests: No results for input(s): TSH, T4TOTAL, FREET4, T3FREE, THYROIDAB in the last 72 hours. Anemia Panel: No results for input(s): VITAMINB12, FOLATE, FERRITIN, TIBC, IRON, RETICCTPCT in the last 72 hours. Urine analysis:    Component Value Date/Time   COLORURINE STRAW (A) 07/06/2017 1805   APPEARANCEUR CLEAR 07/06/2017 1805   LABSPEC 1.006 07/06/2017 1805   PHURINE 7.0 07/06/2017 1805   GLUCOSEU NEGATIVE 07/06/2017 1805   HGBUR NEGATIVE 07/06/2017 1805   BILIRUBINUR NEGATIVE 07/06/2017 1805   KETONESUR NEGATIVE 07/06/2017 1805   PROTEINUR NEGATIVE 07/06/2017 1805   NITRITE NEGATIVE 07/06/2017 1805   LEUKOCYTESUR NEGATIVE 07/06/2017 1805    Recent Results (from the past 240 hour(s))  CSF culture     Status: None (Preliminary result)   Collection Time: 07/06/17  8:45 PM  Result Value Ref Range Status   Specimen Description   Final    CSF Performed at [facility], [address]  [place], [place] [address]    Special Requests   Final    NONE Performed at [facility], [address] [place], [place] [address]    Gram Stain   Final    CYTOSPIN SMEAR WBC PRESENT, PREDOMINANTLY MONONUCLEAR GRAM POSITIVE COCCI CRITICAL RESULT CALLED TO, READ BACK BY AND VERIFIED WITH: Rema Fendt RN, AT [phone] 07/07/17 BY D. VANHOOK    Culture   Final    NO GROWTH < 12 HOURS Performed at [facility], [place] [address], [place], [place] [address]    Report Status PENDING  Incomplete  Culture, blood (routine x 2)     Status: None (Preliminary result)   Collection Time: 07/06/17  9:52 PM  Result Value Ref Range Status   Specimen Description RIGHT ANTECUBITAL  Final   Special Requests   Final    BOTTLES DRAWN AEROBIC AND ANAEROBIC Blood Culture adequate volume   Culture   Final    NO GROWTH 2 DAYS Performed at [facility], [address], [place], [place] [address]    Report Status PENDING  Incomplete  Culture, blood (routine x 2)     Status: None (Preliminary result)   Collection Time: 07/06/17  9:55 PM  Result Value Ref Range Status   Specimen Description RIGHT ANTECUBITAL  Final   Special Requests   Final    BOTTLES DRAWN AEROBIC AND ANAEROBIC Blood Culture adequate volume   Culture   Final    NO GROWTH 2 DAYS Performed at [facility], [address] [place], [place] [address]    Report Status PENDING  Incomplete  MRSA PCR Screening     Status: None   Collection Time: 07/07/17  1:35 AM  Result Value Ref Range Status   MRSA by PCR NEGATIVE NEGATIVE Final    Comment:        The GeneXpert MRSA Assay (FDA approved for NASAL specimens only), is one component of a comprehensive MRSA colonization surveillance program. It is not intended to diagnose MRSA infection nor to guide or monitor treatment for MRSA infections. Performed at [facility], [address] [place], [place] [address]  Radiology Studies: Ct Head Wo Contrast  Result Date: 07/06/2017 CLINICAL DATA:   22 y/o M; worse headache of life upon waking, light sensitivity. EXAM: CT HEAD WITHOUT CONTRAST TECHNIQUE: Contiguous axial images were obtained from the base of the skull through the vertex without intravenous contrast. COMPARISON:  None. FINDINGS: Brain: No evidence of acute infarction, hemorrhage, hydrocephalus, extra-axial collection or mass lesion/mass effect. Vascular: No hyperdense vessel or unexpected calcification. Skull: Normal. Negative for fracture or focal lesion. Sinuses/Orbits: Moderate right maxillary, mild anterior ethmoid, and mild frontal sinus mucosal thickening. Normal aeration of mastoid air cells. Orbits are unremarkable. Other: None. IMPRESSION: 1. No acute intracranial abnormality identified. Unremarkable CT of the brain. 2. Frontal, anterior ethmoid, and right maxillary sinus disease. Electronically Signed   By: Kristine Garbe M.D.   On: 07/06/2017 19:28   Dg Chest Port 1v Same Day  Result Date: 07/07/2017 CLINICAL DATA:  Acute onset of sharp generalized chest pain. Headache and photosensitivity. EXAM: PORTABLE CHEST 1 VIEW COMPARISON:  None. FINDINGS: The lungs are well-aerated and clear. There is no evidence of focal opacification, pleural effusion or pneumothorax. The cardiomediastinal silhouette is within normal limits. No acute osseous abnormalities are seen. IMPRESSION: No acute cardiopulmonary process seen. Electronically Signed   By: Garald Balding M.D.   On: 07/07/2017 00:50    Scheduled Meds: . dexamethasone  10 mg Oral Q6H   Continuous Infusions: . sodium chloride 75 mL/hr at 07/08/17 0843  . acyclovir Stopped (07/08/17 0107)  . cefTRIAXone (ROCEPHIN)  IV Stopped (07/07/17 2200)  . methocarbamol (ROBAXIN)  IV    . vancomycin Stopped (07/08/17 0112)     LOS: 1 day    Time spent: 35 minutes.    Barton Dubois, MD [facility] Pager [phone]  If 7PM-7AM, please contact night-coverage [email or web address] Password [email or web address] 07/08/2017, 9:40 AM

## 2017-07-08 NOTE — Progress Notes (Signed)
Patient wanted to leave AMA did not state reason why but stated he needed to leave. Me and RN on day shift told him the risk of leaving and having his infection spreading or getting worse. Also told him about the risk of other complications we also had the [facility] come and talk with him, patient stated he understood the risk and still wanted to leave. Told him that insurance probably would not pay for his visit if he left AMA. Patient verbalized understanding and signed AMA paper. Told mid level about patient wanted to leave. Removed both IVs. Patient took all belongs and left floor at 1935.

## 2017-07-09 ENCOUNTER — Emergency Department (HOSPITAL_COMMUNITY)
Admission: EM | Admit: 2017-07-09 | Discharge: 2017-07-09 | Discharge status: Against Medical Advice | Payer: BLUE CROSS/BLUE SHIELD

## 2017-07-09 ENCOUNTER — Other Ambulatory Visit: Payer: Self-pay

## 2017-07-09 LAB — HERPES SIMPLEX VIRUS(HSV) DNA BY PCR
HSV 1 DNA: NEGATIVE
HSV 2 DNA: NEGATIVE

## 2017-07-09 LAB — ENTEROVIRUS PCR: Enterovirus PCR: NEGATIVE

## 2017-07-09 NOTE — ED Notes (Signed)
Patient walked out, cursing and talking under his breath. Ambulatory with steady gait. RN attempted to talk to patient as he was walking out, but patient had already walked outside.

## 2017-07-10 LAB — CSF CULTURE W GRAM STAIN: Culture: NO GROWTH

## 2017-07-10 LAB — CSF CULTURE

## 2017-07-11 LAB — CULTURE, BLOOD (ROUTINE X 2)
Culture: NO GROWTH
Culture: NO GROWTH
Special Requests: ADEQUATE
Special Requests: ADEQUATE

## 2017-07-13 LAB — HSV(HERPES SMPLX VRS)ABS-I+II(IGG)-CSF: HSV Type I/II Ab, IgG CSF: <0.34 IV (ref <=0.89)

## 2017-07-16 NOTE — Discharge Summary (Signed)
Physician Discharge Summary  Pearlie Lauro GQQ:[id number] DOB: 1995-03-25 DOA: 07/06/2017  PCP: Patient, No Pcp Per  Admit date: 07/06/2017 Discharge date: 07/16/2017  Time spent: 25 minutes  Recommendations for Outpatient Follow-up:  Patient left AMA.   Discharge Diagnoses:  Active Problems:   Viral meningitis   Discharge Condition: patient still having headaches and with pending workup; actively receiving treatment for meningitis. He left against medical advice.    Filed Weights   07/06/17 1739 07/07/17 0139 07/07/17 0500  Weight: 73 kg (161 lb) 73.5 kg (162 lb 0.6 oz) 73.5 kg (162 lb 0.6 oz)    Brief History of present illness:  22 year old male without past medical history of significance who presented to the emergency department secondary to neck pain, headache, fever and photophobia.    Admitted for further treatment and management of meningitis.  Patient with gram-positive cocci growing in his CSF.   Hospital Course:  1-meningitis: -was actively receiving empiric therapy for meningitis using rocephin, vancomycin and acyclovir. Given gram-positive cocci growth on CSF patient also started on dexamethazone. -he was improving and afebrile. -left against medical advice   2-bradycardia: -probably in response from slight increase in his intracranial pressure from meningitis vs mild sinus bradycardia as he is young and physically active. -no abnormalities seen on echo -Heart rate was normal/stable and sinus on last evaluation.  3-GI prophylaxis  -placed on protonix.   Procedures:  See below for x-ray reports   LP  2-D echo - Left ventricle: The cavity size was normal. Wall thickness was at the upper limits of normal. Systolic function was normal. The estimated ejection fraction was in the range of 60% to 65%. Wall motion was normal; there were no regional wall motion abnormalities. Left ventricular diastolic function parameters were normal. - Mitral  valve: There was trivial regurgitation. - Right atrium: Central venous pressure (est): 3 mm Hg. - Atrial septum: No defect or patent foramen ovale was identified. - Tricuspid valve: There was trivial regurgitation. - Pulmonary arteries: Systolic pressure could not be accurately estimated. - Pericardium, extracardiac: There was no pericardial effusion.   Consultations:  None   Discharge Exam: Vitals:   07/08/17 1139 07/08/17 1300  BP:    Pulse:    Resp: 16 15  Temp:  98.1 F (36.7 C)  SpO2:      Discharge Instructions  Left AMA.  Allergies as of 07/08/2017   No Known Allergies     Medication List    You have not been prescribed any medications.    No Known Allergies    The results of significant diagnostics from this hospitalization (including imaging, microbiology, ancillary and laboratory) are listed below for reference.    Significant Diagnostic Studies: Ct Head Wo Contrast  Result Date: 07/06/2017 CLINICAL DATA:  22 y/o M; worse headache of life upon waking, light sensitivity. EXAM: CT HEAD WITHOUT CONTRAST TECHNIQUE: Contiguous axial images were obtained from the base of the skull through the vertex without intravenous contrast. COMPARISON:  None. FINDINGS: Brain: No evidence of acute infarction, hemorrhage, hydrocephalus, extra-axial collection or mass lesion/mass effect. Vascular: No hyperdense vessel or unexpected calcification. Skull: Normal. Negative for fracture or focal lesion. Sinuses/Orbits: Moderate right maxillary, mild anterior ethmoid, and mild frontal sinus mucosal thickening. Normal aeration of mastoid air cells. Orbits are unremarkable. Other: None. IMPRESSION: 1. No acute intracranial abnormality identified. Unremarkable CT of the brain. 2. Frontal, anterior ethmoid, and right maxillary sinus disease. Electronically Signed   By: Kristine Garbe M.D.   On:  07/06/2017 19:28   Dg Chest Port 1v Same Day  Result Date: 07/07/2017 CLINICAL DATA:   Acute onset of sharp generalized chest pain. Headache and photosensitivity. EXAM: PORTABLE CHEST 1 VIEW COMPARISON:  None. FINDINGS: The lungs are well-aerated and clear. There is no evidence of focal opacification, pleural effusion or pneumothorax. The cardiomediastinal silhouette is within normal limits. No acute osseous abnormalities are seen. IMPRESSION: No acute cardiopulmonary process seen. Electronically Signed   By: Garald Balding M.D.   On: 07/07/2017 00:50    Microbiology: Recent Results (from the past 240 hour(s))  CSF culture     Status: None   Collection Time: 07/06/17  8:45 PM  Result Value Ref Range Status   Specimen Description   Final    CSF Performed at [facility], [address], [place], [place] [address]    Special Requests   Final    NONE Performed at [facility], [address] [place], [place] [address]    Gram Stain   Final    CYTOSPIN SMEAR WBC PRESENT, PREDOMINANTLY MONONUCLEAR GRAM POSITIVE COCCI CRITICAL RESULT CALLED TO, READ BACK BY AND VERIFIED WITH: Rema Fendt RN, AT [phone] 07/07/17 BY D. Victoriano Lain    Culture   Final    NO GROWTH 3 DAYS Performed at [facility], [place] [address], [place], [place] [address]    Report Status 07/10/2017 FINAL  Final  Culture, blood (routine x 2)     Status: None   Collection Time: 07/06/17  9:52 PM  Result Value Ref Range Status   Specimen Description RIGHT ANTECUBITAL  Final   Special Requests   Final    BOTTLES DRAWN AEROBIC AND ANAEROBIC Blood Culture adequate volume   Culture   Final    NO GROWTH 5 DAYS Performed at [facility], [address], [place], [place] [address]    Report Status 07/11/2017 FINAL  Final  Culture, blood (routine x 2)     Status: None   Collection Time: 07/06/17  9:55 PM  Result Value Ref Range Status   Specimen Description RIGHT ANTECUBITAL  Final   Special Requests   Final    BOTTLES DRAWN AEROBIC AND ANAEROBIC Blood Culture adequate volume   Culture   Final    NO GROWTH 5  DAYS Performed at [facility], [address], [place], [place] [address]    Report Status 07/11/2017 FINAL  Final  MRSA PCR Screening     Status: None   Collection Time: 07/07/17  1:35 AM  Result Value Ref Range Status   MRSA by PCR NEGATIVE NEGATIVE Final    Comment:        The GeneXpert MRSA Assay (FDA approved for NASAL specimens only), is one component of a comprehensive MRSA colonization surveillance program. It is not intended to diagnose MRSA infection nor to guide or monitor treatment for MRSA infections. Performed at [facility], [address], [place], [place] [address]      Signed:  Barton Dubois MD.  [facility] 07/16/2017, 9:28 AM

## 2018-02-05 DIAGNOSIS — J111 Influenza due to unidentified influenza virus with other respiratory manifestations: Secondary | ICD-10-CM | POA: Diagnosis not present

## 2018-02-05 DIAGNOSIS — R1030 Lower abdominal pain, unspecified: Secondary | ICD-10-CM | POA: Diagnosis not present

## 2018-02-10 ENCOUNTER — Encounter: Payer: Self-pay | Admitting: Internal Medicine

## 2018-04-20 ENCOUNTER — Encounter: Payer: Self-pay | Admitting: Internal Medicine

## 2018-04-20 ENCOUNTER — Ambulatory Visit: Payer: BLUE CROSS/BLUE SHIELD | Admitting: Nurse Practitioner

## 2018-04-20 ENCOUNTER — Telehealth: Payer: Self-pay | Admitting: Nurse Practitioner

## 2018-04-20 NOTE — Telephone Encounter (Signed)
PATIENT WAS A NO SHOW AND LETTER SENT  °

## 2018-04-20 NOTE — Progress Notes (Deleted)
Primary Care Physician:  Leisa Lenz, MD Primary Gastroenterologist:  Dr. Gala Romney  No chief complaint on file.   HPI:   Andrew Waller is a 23 y.o. male who presents on referral from primary care for lower abdominal pain.  Reviewed information provided with the referral including ***.  No history of colonoscopy or endoscopy in our system.  Today he states   No past medical history on file.  No past surgical history on file.  No current outpatient medications on file.   No current facility-administered medications for this visit.     Allergies as of 04/20/2018  . (No Known Allergies)    No family history on file.  Social History   Socioeconomic History  . Marital status: Single    Spouse name: Not on file  . Number of children: Not on file  . Years of education: Not on file  . Highest education level: Not on file  Occupational History  . Not on file  Social Needs  . Financial resource strain: Not on file  . Food insecurity:    Worry: Not on file    Inability: Not on file  . Transportation needs:    Medical: Not on file    Non-medical: Not on file  Tobacco Use  . Smoking status: Former Research scientist (life sciences)  . Smokeless tobacco: Former Network engineer and Sexual Activity  . Alcohol use: Not Currently  . Drug use: Yes    Types: Marijuana  . Sexual activity: Not on file  Lifestyle  . Physical activity:    Days per week: Not on file    Minutes per session: Not on file  . Stress: Not on file  Relationships  . Social connections:    Talks on phone: Not on file    Gets together: Not on file    Attends religious service: Not on file    Active member of club or organization: Not on file    Attends meetings of clubs or organizations: Not on file    Relationship status: Not on file  . Intimate partner violence:    Fear of current or ex partner: Not on file    Emotionally abused: Not on file    Physically abused: Not on file    Forced sexual activity: Not on file   Other Topics Concern  . Not on file  Social History Narrative  . Not on file    Review of Systems: General: Negative for anorexia, weight loss, fever, chills, fatigue, weakness. Eyes: Negative for vision changes.  ENT: Negative for hoarseness, difficulty swallowing , nasal congestion. CV: Negative for chest pain, angina, palpitations, dyspnea on exertion, peripheral edema.  Respiratory: Negative for dyspnea at rest, dyspnea on exertion, cough, sputum, wheezing.  GI: See history of present illness. GU:  Negative for dysuria, hematuria, urinary incontinence, urinary frequency, nocturnal urination.  MS: Negative for joint pain, low back pain.  Derm: Negative for rash or itching.  Neuro: Negative for weakness, abnormal sensation, seizure, frequent headaches, memory loss, confusion.  Psych: Negative for anxiety, depression, suicidal ideation, hallucinations.  Endo: Negative for unusual weight change.  Heme: Negative for bruising or bleeding. Allergy: Negative for rash or hives.    Physical Exam: There were no vitals taken for this visit. General:   Alert and oriented. Pleasant and cooperative. Well-nourished and well-developed.  Head:  Normocephalic and atraumatic. Eyes:  Without icterus, sclera clear and conjunctiva pink.  Ears:  Normal auditory acuity. Mouth:  No deformity or lesions,  oral mucosa pink.  Throat/Neck:  Supple, without mass or thyromegaly. Cardiovascular:  S1, S2 present without murmurs appreciated. Normal pulses noted. Extremities without clubbing or edema. Respiratory:  Clear to auscultation bilaterally. No wheezes, rales, or rhonchi. No distress.  Gastrointestinal:  +BS, soft, non-tender and non-distended. No HSM noted. No guarding or rebound. No masses appreciated.  Rectal:  Deferred  Musculoskalatal:  Symmetrical without gross deformities. Normal posture. Skin:  Intact without significant lesions or rashes. Neurologic:  Alert and oriented x4;  grossly normal  neurologically. Psych:  Alert and cooperative. Normal mood and affect. Heme/Lymph/Immune: No significant cervical adenopathy. No excessive bruising noted.    04/20/2018 7:44 AM   Disclaimer: This note was dictated with voice recognition software. Similar sounding words can inadvertently be transcribed and may not be corrected upon review.

## 2018-11-25 IMAGING — CT CT HEAD W/O CM
3 series · 15 of 47 positions shown, 18 images · non-contrast
Comparison: None.

CLINICAL DATA: 21 y/o M; worse headache of life upon waking, light
sensitivity.

EXAM:
CT HEAD WITHOUT CONTRAST
TECHNIQUE: Contiguous axial images were obtained from the base of the skull
through the vertex without intravenous contrast.

[Series 2: head wo · axial · 0.43mm/px · z∈[+64,+189]mm · 9 of 30 slices shown, 12 images]
[im 3/30  brain]
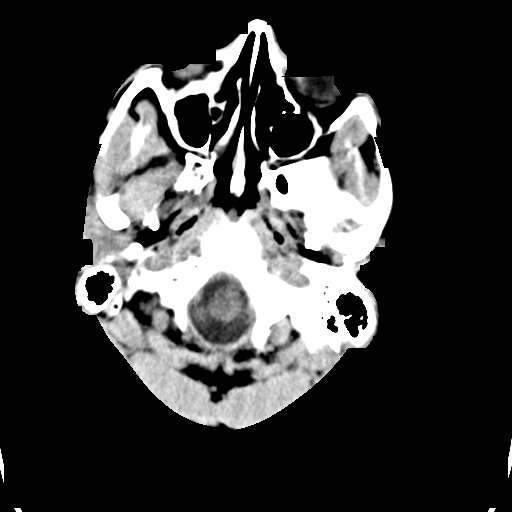
[im 3/30  bone]
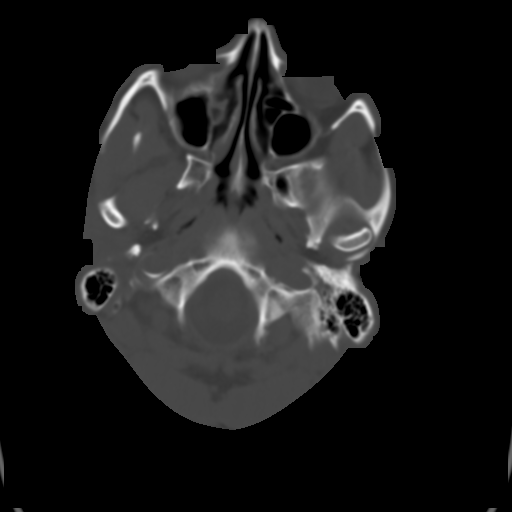
[im 6/30  brain]
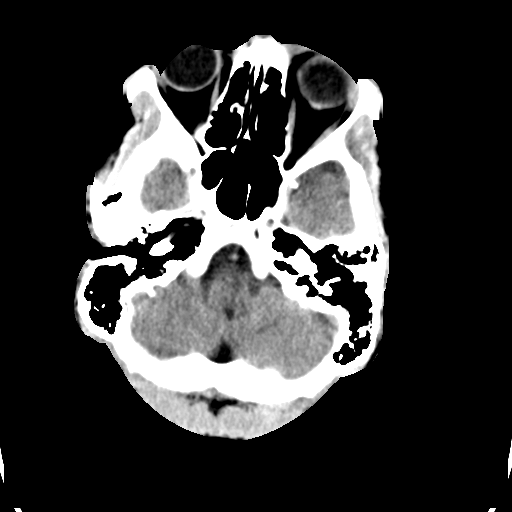
[im 9/30  brain]
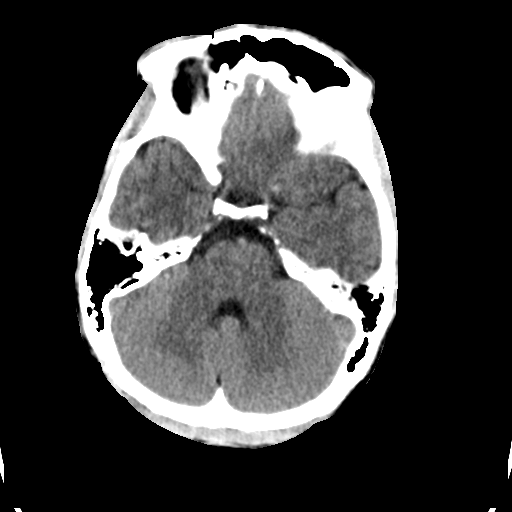
[im 12/30  brain]
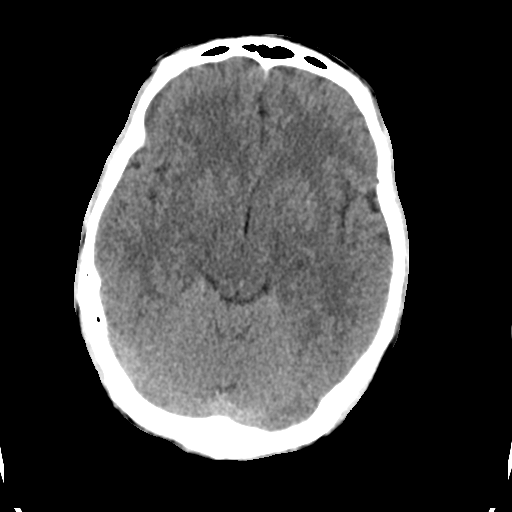
[im 16/30  brain]
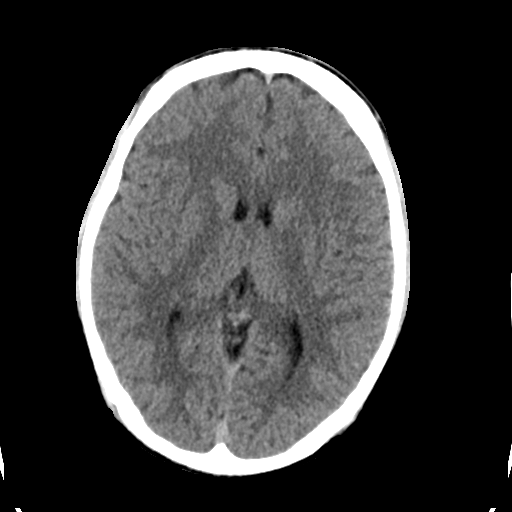
[im 16/30  bone]
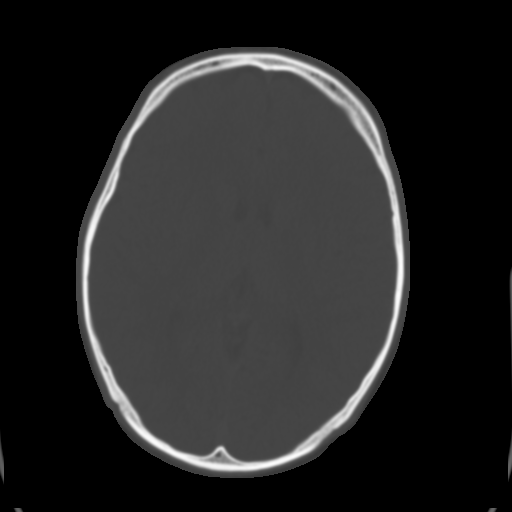
[im 19/30  brain]
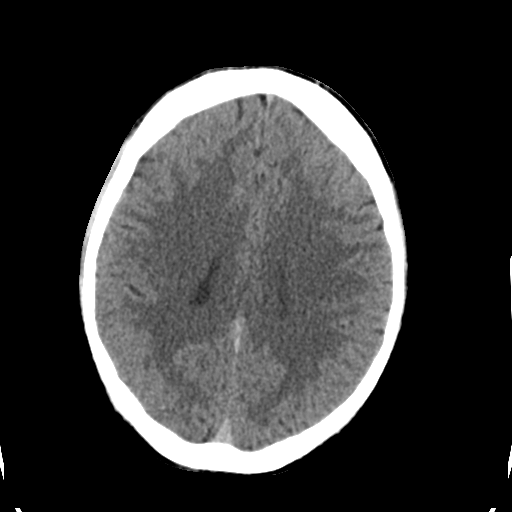
[im 22/30  brain]
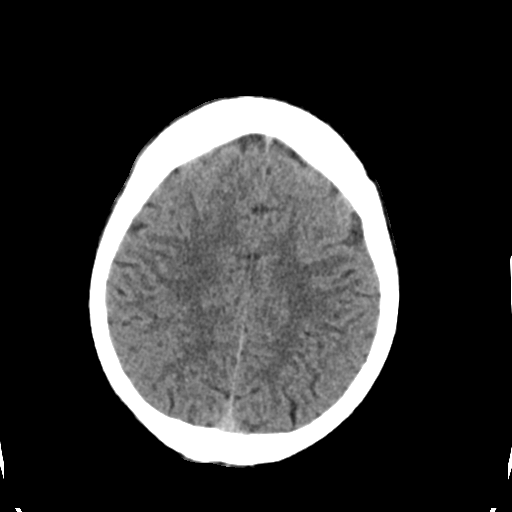
[im 25/30  brain]
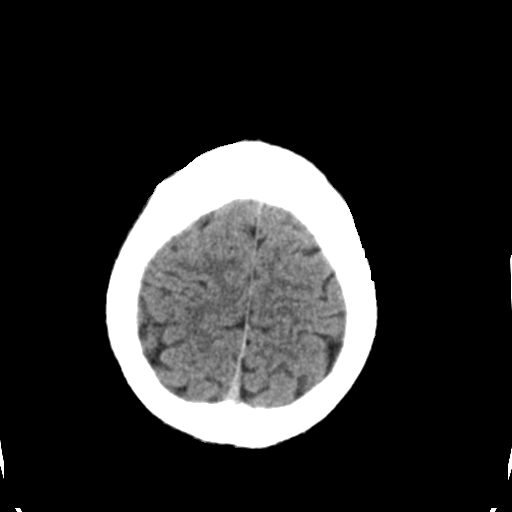
[im 28/30  brain]
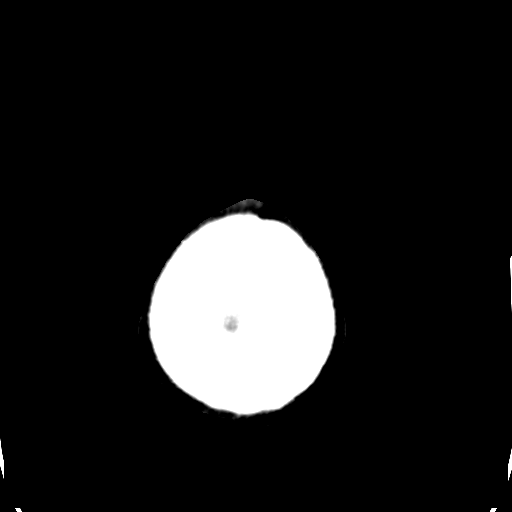
[im 28/30  bone]
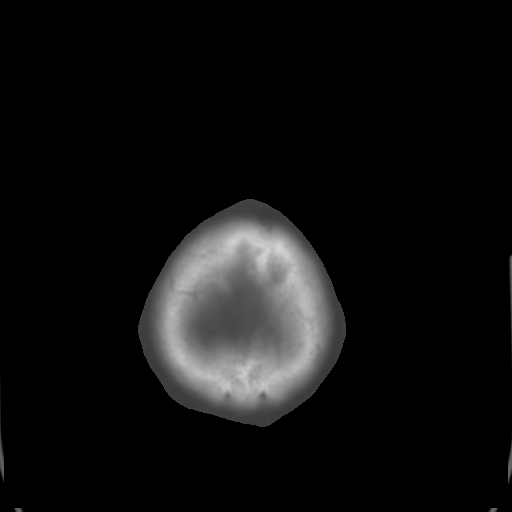

[Series 4: coronal soft tissue · coronal · 0.31mm/px · 3 of 69 slices shown]
[im 23/69  brain]
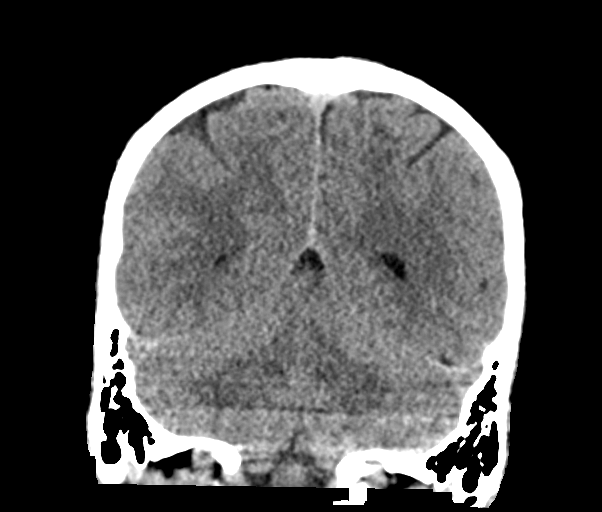
[im 31/69  brain]
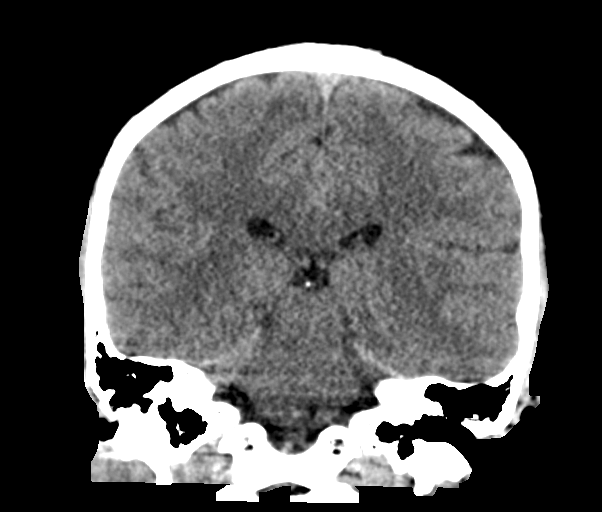
[im 38/69  brain]
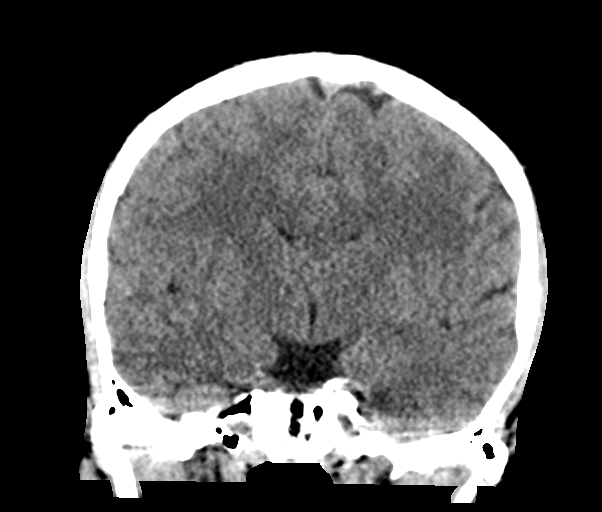

[Series 5: sagittal soft tissue · sagittal · 0.34mm/px · 3 of 54 slices shown]
[im 18/54  brain]
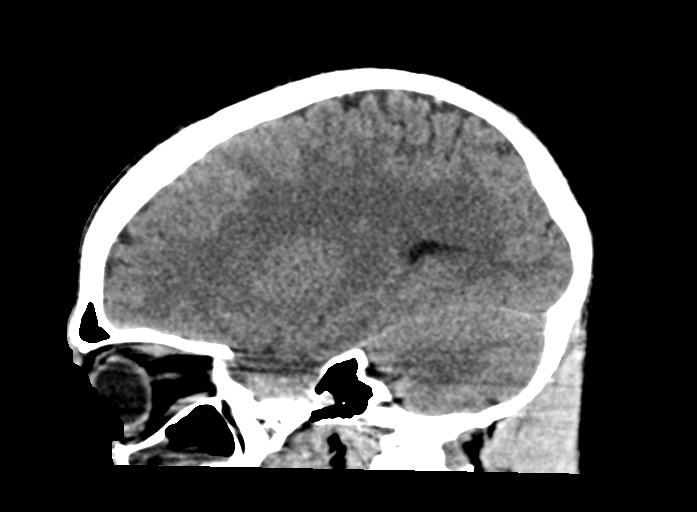
[im 27/54  brain]
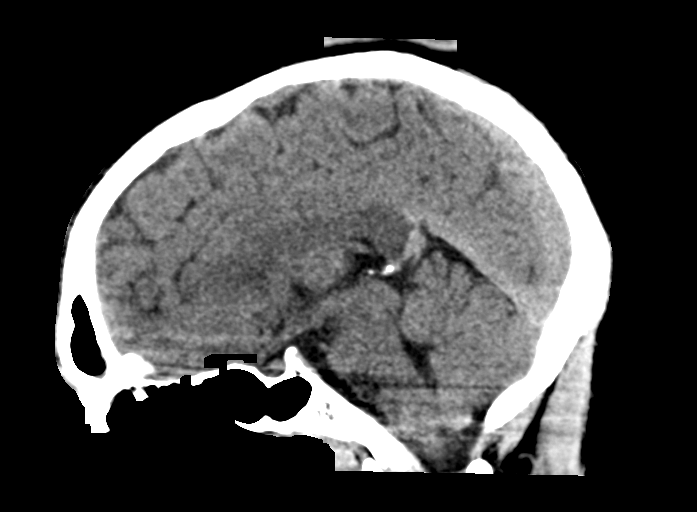
[im 36/54  brain]
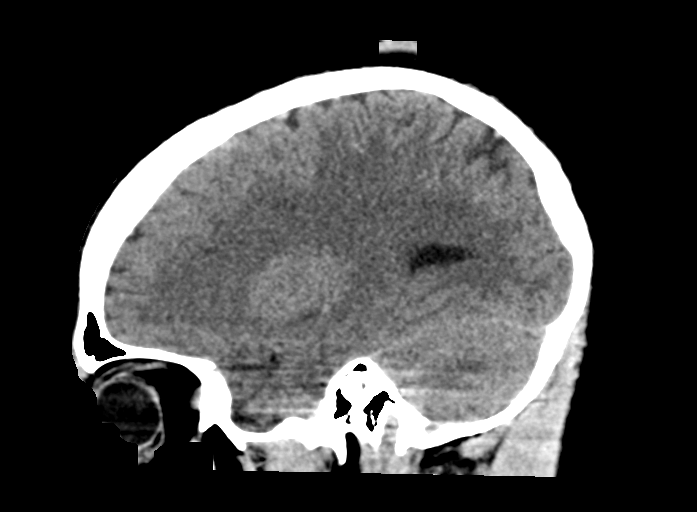

[15 of 47 positions shown; findings below may reference images not displayed]

FINDINGS: Brain: No evidence of acute infarction, hemorrhage, hydrocephalus,
extra-axial collection or mass lesion/mass effect.

Vascular: No hyperdense vessel or unexpected calcification.

Skull: Normal. Negative for fracture or focal lesion.

Sinuses/Orbits: Moderate right maxillary, mild anterior ethmoid, and
mild frontal sinus mucosal thickening. Normal aeration of mastoid
air cells. Orbits are unremarkable.

Other: None.
IMPRESSION: 1. No acute intracranial abnormality identified. Unremarkable CT of
the brain.
2. Frontal, anterior ethmoid, and right maxillary sinus disease.

By: Kaki Jim M.D.

## 2019-01-07 ENCOUNTER — Ambulatory Visit (INDEPENDENT_AMBULATORY_CARE_PROVIDER_SITE_OTHER): Payer: BC Managed Care – PPO | Admitting: Family Medicine

## 2019-01-07 ENCOUNTER — Encounter: Payer: Self-pay | Admitting: Family Medicine

## 2019-01-07 ENCOUNTER — Other Ambulatory Visit: Payer: Self-pay

## 2019-01-07 VITALS — BP 136/85 | HR 63 | Temp 98.6°F | Resp 15 | Ht 69.0 in | Wt 160.1 lb

## 2019-01-07 DIAGNOSIS — Z6379 Other stressful life events affecting family and household: Secondary | ICD-10-CM

## 2019-01-07 DIAGNOSIS — F418 Other specified anxiety disorders: Secondary | ICD-10-CM | POA: Insufficient documentation

## 2019-01-07 DIAGNOSIS — F17200 Nicotine dependence, unspecified, uncomplicated: Secondary | ICD-10-CM | POA: Diagnosis not present

## 2019-01-07 DIAGNOSIS — Z87891 Personal history of nicotine dependence: Secondary | ICD-10-CM | POA: Diagnosis not present

## 2019-01-07 DIAGNOSIS — F1290 Cannabis use, unspecified, uncomplicated: Secondary | ICD-10-CM

## 2019-01-07 DIAGNOSIS — L729 Follicular cyst of the skin and subcutaneous tissue, unspecified: Secondary | ICD-10-CM | POA: Diagnosis not present

## 2019-01-07 MED ORDER — BUSPIRONE HCL 5 MG PO TABS: 5.0000 mg | ORAL_TABLET | Freq: Three times a day (TID) | ORAL | 1 refills | Status: DC

## 2019-01-07 MED ORDER — ESCITALOPRAM OXALATE 10 MG PO TABS: 10.0000 mg | ORAL_TABLET | Freq: Every day | ORAL | 3 refills | Status: DC

## 2019-01-07 NOTE — Patient Instructions (Addendum)
  I appreciate the opportunity to provide you with care for your health and wellness. Today we discussed: overall health/establish care  Follow up: 2 weeks- can be by phone or mychart  No labs   Referral to surgery to look at removal of cyst.  GOALS: Spend more time with son.  Exercise again.   Increase water intake.  Start taking medication as directed.   I hope you have a wonderful, happy, safe, and healthy Holiday Season! See you in the New Year :)  Please continue to practice social distancing to keep you, your family, and our community safe.  If you must go out, please wear a mask and practice good handwashing.  It was a pleasure to see you and I look forward to continuing to work together on your health and well-being. Please do not hesitate to call the office if you need care or have questions about your care.  Have a wonderful day and week. With Gratitude, Cherly Beach, DNP, AGNP-BC

## 2019-01-07 NOTE — Progress Notes (Signed)
Subjective:     Patient ID: Andrew Waller, male   DOB: 1995/04/19, 23 y.o.   MRN: [id number]  Tashon Oguinn presents for New Patient (Initial Visit) (establish care)  Mr. Buelna is a 23 year old male patient who presents today to establish care.  Reports that he is overall doing okay but is going through a separation from his significant other who is also the mother of their 8-year-old son.  Reports that he just recently moved back in with his mother due to this.  Is having a lot of anxiety and stress dealing with this.  Additionally he reports that he has a questionable spot on his neck where 2 pieces of wood pinched his skin in his neck and he formed a knot about a year ago that never went away.  Would like to see about possible removal of this with surgery.  Is a current vape user, ex-smoker of a year and a half.  Does not use alcohol at this time.  Does smoke marijuana occasionally.  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  Reports not having a good diet at this time.  Has lost a lot of weight in the last month since the whole situation with his significant other is happened.  Reports he drinks 5-6 red bowls a day plus coffee.  Drinks only about 1 bottle of water a day.  Reports that he would like to get in better shape and used to love going to the gym would like to get back into that.  History reviewed. No pertinent past medical history. History reviewed. No pertinent surgical history. Social History   Socioeconomic History  . Marital status: Single    Spouse name: Not on file  . Number of children: 1  . Years of education: Not on file  . Highest education level: 12th grade  Occupational History  . Occupation: [employer]   Social Needs  . Financial resource strain: Not hard at all  . Food insecurity    Worry: Never true    Inability: Never true  . Transportation needs   Medical: No    Non-medical: No  Tobacco Use  . Smoking status: Former Smoker    Types: Cigarettes    Quit date: 02/06/2017    Years since quitting: 1.9  . Smokeless tobacco: Former Network engineer and Sexual Activity  . Alcohol use: Not Currently  . Drug use: Yes    Types: Marijuana  . Sexual activity: Yes  Lifestyle  . Physical activity    Days per week: 0 days    Minutes per session: 0 min  . Stress: Rather much  Relationships  . Social Herbalist on phone: Three times a week    Gets together: Three times a week    Attends religious service: Never    Active member of club or organization: No    Attends meetings of clubs or organizations: Never    Relationship status: Never married  . Intimate partner violence    Fear of current or ex partner: No    Emotionally abused: No    Physically abused: No    Forced sexual activity: No  Other Topics Concern  . Not on file  Social History Narrative   Lives with mother since splitting from ex   Son currently shared with weekends-he stays with his mom do childcare during the day.  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     Outpatient Encounter Medications as of 01/07/2019  Medication Sig  . busPIRone (BUSPAR) 5 MG tablet Take 1 tablet (5 mg total) by mouth 3 (three) times daily.  Marland Kitchen escitalopram (LEXAPRO) 10 MG tablet Take 1 tablet (10 mg total) by mouth daily.   No facility-administered encounter medications on file as of 01/07/2019.    No Known Allergies  Review of Systems  Constitutional: Negative.   HENT: Negative.   Eyes: Negative.   Respiratory: Negative.    Cardiovascular: Negative.   Gastrointestinal: Negative.   Endocrine: Negative.   Genitourinary: Negative.   Musculoskeletal: Negative.   Skin: Negative.   Allergic/Immunologic: Negative.   Neurological: Positive for headaches.  Hematological: Negative.   Psychiatric/Behavioral: Positive for sleep disturbance. The patient is nervous/anxious.   All other systems reviewed and are negative.      Objective:     BP 136/85   Pulse 63   Temp 98.6 F (37 C) (Oral)   Resp 15   Ht 5\' 9"  (1.753 m)   Wt 160 lb 1.9 oz (72.6 kg)   SpO2 99%   BMI 23.65 kg/m   Physical Exam Vitals signs and nursing note reviewed.  Constitutional:      Appearance: Normal appearance. He is well-developed, well-groomed and normal weight.  HENT:     Head: Normocephalic and atraumatic.     Right Ear: External ear normal.     Left Ear: External ear normal.     Nose: Nose normal.     Mouth/Throat:     Mouth: Mucous membranes are moist.     Pharynx: Oropharynx is clear.  Eyes:     General:        Right eye: No discharge.        Left eye: No discharge.     Conjunctiva/sclera: Conjunctivae normal.  Neck:     Musculoskeletal: Normal range of motion and neck supple.  Cardiovascular:     Rate and Rhythm: Normal rate and regular rhythm.     Pulses: Normal pulses.     Heart sounds: Normal heart sounds.  Pulmonary:     Effort: Pulmonary effort is normal.     Breath sounds: Normal breath sounds.  Musculoskeletal: Normal range of motion.  Skin:    General: Skin is warm.       Neurological:     General: No focal deficit present.     Mental Status: He is alert and oriented to person, place, and time.  Psychiatric:        Attention and Perception: Attention and perception normal.        Mood and Affect: Mood is anxious.        Speech: Speech normal.        Behavior: Behavior is hyperactive. Behavior is cooperative.        Thought Content: Thought content normal.        Cognition and Memory: Cognition  and memory normal.        Judgment:  Judgment normal.    GAD 7 : Generalized Anxiety Score 01/07/2019  Nervous, Anxious, on Edge 3  Control/stop worrying 2  Worry too much - different things 3  Trouble relaxing 2  Restless 3  Easily annoyed or irritable 3  Afraid - awful might happen 2  Total GAD 7 Score 18  Anxiety Difficulty Very difficult        Assessment and Plan        1. Situational anxiety Symptoms of situational anxiety secondary to separation from live-in girlfriend/fianc.  High GAD discussion with therapy and/or medication use.  Opted for medication use at this time.  Educated on self-care routines and reestablishment of things that can help him manage the stress and anxiety he feels from the situation he is going through.  Advised that if he does not want to start the medication at this time that is okay if he wants to give himself a little bit time to reanalyze his lifestyle and what he can do to make himself feel better.  Close follow-up 2 weeks.  Advised to notify us if he needs [place] before then. Reviewed side effects, risks and benefits of medication.   Patient acknowledged agreement and understanding of the plan.    - escitalopram (LEXAPRO) 10 MG tablet; Take 1 tablet (10 mg total) by mouth daily.  Dispense: 30 tablet; Refill: 3 - busPIRone (BUSPAR) 5 MG tablet; Take 1 tablet (5 mg total) by mouth 3 (three) times daily.  Dispense: 90 tablet; Refill: 1  2. Skin cyst Skin cyst, fatty tissue deposit secondary to having an extreme pendulum would on his neck a year ago.  Wants to have a referral to surgery to have this looked at being removed.  Some pain at times but overall not giving any trouble just does not like the appearance of it.  Appreciate collaboration in his care.  -Referral for general surgery  3. Ex-smoker Applauded that he is able to stop smoking cigarettes encouraged to stop vaping.   4. Vaping nicotine dependence, non-tobacco product Vaping daily.  Encouraged to stop. Would like to stop, but currently not at tome of list.  5. Stressful life events affecting family and household Recent separation from significant other of several years and mother of his son. Recent move back into mothers home.  6. Marijuana use Encouraged not to use recreational drugs including but not limited to marijuana use.  Follow-up: 2 weeks  Perlie Mayo, DNP, AGNP-BC [place], [address], [place] [address] Office Hours: Mon-Thurs 8 am-5 pm; Fri 8 am-12 pm Office Phone:  [phone]  Office Fax: [phone]

## 2019-01-12 ENCOUNTER — Ambulatory Visit: Payer: BLUE CROSS/BLUE SHIELD | Admitting: Family Medicine

## 2019-01-13 ENCOUNTER — Ambulatory Visit: Payer: BLUE CROSS/BLUE SHIELD | Admitting: Family Medicine

## 2019-01-21 ENCOUNTER — Ambulatory Visit (INDEPENDENT_AMBULATORY_CARE_PROVIDER_SITE_OTHER): Payer: BC Managed Care – PPO | Admitting: General Surgery

## 2019-01-21 ENCOUNTER — Encounter: Payer: Self-pay | Admitting: General Surgery

## 2019-01-21 ENCOUNTER — Telehealth: Payer: BC Managed Care – PPO | Admitting: Family Medicine

## 2019-01-21 ENCOUNTER — Other Ambulatory Visit: Payer: Self-pay

## 2019-01-21 VITALS — BP 126/84 | HR 69 | Temp 98.3°F | Resp 16 | Ht 69.0 in | Wt 161.0 lb

## 2019-01-21 DIAGNOSIS — L720 Epidermal cyst: Secondary | ICD-10-CM | POA: Diagnosis not present

## 2019-01-21 NOTE — Progress Notes (Signed)
[facility] History and Physical  Reason for Referral: Cyst on neck  Referring Physician: Perlie Mayo, NP   Chief Complaint    Cyst      Andrew Waller is a 23 y.o. male.  HPI: Andrew Waller is a very sweet 23 yo who has a history of trauma to his skin of the left neck after carry pieces of wood last year and having two pieces of wood pinch his neck skin. Since that time he has had a swollen lump that has never enlarged or drained or become infected, but has persisted.  It is mobile and superficial. He denies any pain but says that it does bother him when he is exercising and his clothes rub against it.  He reports that he has had some personal issues in the last few months that have caused him some anxiety, and he was prescribed medication for this, but has not been taking them due to have the medications made him feel.    Past Medical History:  Diagnosis Date  . Anxiety     History reviewed. No pertinent surgical history.  Family History  Problem Relation Age of Onset  . Diabetes Mother   . Sleep apnea Mother   . Hypertension Mother   . Obesity Mother   . Hypertension Father     Social History   Tobacco Use  . Smoking status: Former Smoker    Types: Cigarettes    Quit date: 02/06/2017    Years since quitting: 1.9  . Smokeless tobacco: Former Network engineer Use Topics  . Alcohol use: Not Currently  . Drug use: Yes    Types: Marijuana    Medications: I have reviewed the patient's current medications. No taking the medications below.  Allergies as of 01/21/2019   No Known Allergies     Medication List       Accurate as of January 21, 2019 11:59 PM. If you have any questions, ask your nurse or doctor.        busPIRone 5 MG tablet Commonly known as: BUSPAR Take 1 tablet (5 mg total) by mouth 3 (three) times daily.   escitalopram 10 MG tablet Commonly known as: LEXAPRO Take 1 tablet (10 mg total) by mouth daily.        ROS:  A  comprehensive review of systems was negative except for: small lump on lower left neck  Blood pressure 126/84, pulse 69, temperature 98.3 F (36.8 C), temperature source Oral, resp. rate 16, height 5\' 9"  (1.753 m), weight 161 lb (73 kg), SpO2 98 %. Physical Exam Vitals reviewed.  Constitutional:      Appearance: Normal appearance. He is normal weight.  HENT:     Head: Normocephalic and atraumatic.     Nose: Nose normal.     Mouth/Throat:     Mouth: Mucous membranes are moist.  Eyes:     Extraocular Movements: Extraocular movements intact.     Pupils: Pupils are equal, round, and reactive to light.  Neck:     Comments: 1cm superficial lump on the left lower neck just above the clavicular head Cardiovascular:     Rate and Rhythm: Normal rate and regular rhythm.  Pulmonary:     Effort: Pulmonary effort is normal.     Breath sounds: Normal breath sounds.  Abdominal:     General: There is no distension.     Palpations: Abdomen is soft.     Tenderness: There is no abdominal tenderness.  Musculoskeletal:  General: No swelling. Normal range of motion.     Cervical back: Normal range of motion. No rigidity.  Skin:    General: Skin is warm and dry.  Neurological:     General: No focal deficit present.     Mental Status: He is alert and oriented to person, place, and time.  Psychiatric:        Mood and Affect: Mood normal.        Behavior: Behavior normal.        Thought Content: Thought content normal.        Judgment: Judgment normal.     Results: Bedside US- small superficial cyst like area above the muscle in the left neck, 1cm in size   Assessment & Plan:  Andrew Waller is a 23 y.o. male with a superficial cyst like from the trauma with the skin being pinched.  The area is bothersome when he exercises and he would like to have it removed. Discussed superficial nature and ability to remove under local. Discussed ellipse of skin to remove, discussed risk of bleeding,  infection, recurrence and sutures that would be removed in about 10-14 days.   -Minor room for excision of cyst of the left neck   All questions were answered to the satisfaction of the patient.   Virl Cagey 01/24/2019, 11:35 AM

## 2019-01-21 NOTE — Patient Instructions (Signed)
Epidermal Cyst  An epidermal cyst is a sac made of skin tissue. The sac contains a substance called keratin. Keratin is a protein that is normally secreted through the hair follicles. When keratin becomes trapped in the top layer of skin (epidermis), it can form an epidermal cyst. Epidermal cysts can be found anywhere on your body. These cysts are usually harmless (benign), and they may not cause symptoms unless they become infected. What are the causes? This condition may be caused by:  A blocked hair follicle.  A hair that curls and re-enters the skin instead of growing straight out of the skin (ingrown hair).  A blocked pore.  Irritated skin.  An injury to the skin.  Certain conditions that are passed along from parent to child (inherited).  Human papillomavirus (HPV).  Long-term (chronic) sun damage to the skin. What increases the risk? The following factors may make you more likely to develop an epidermal cyst:  Having acne.  Being overweight.  Being 30-40 years old. What are the signs or symptoms? The only symptom of this condition may be a small, painless lump underneath the skin. When an epidermal cyst ruptures, it may become infected. Symptoms may include:  Redness.  Inflammation.  Tenderness.  Warmth.  Fever.  Keratin draining from the cyst. Keratin is grayish-white, bad-smelling substance.  Pus draining from the cyst. How is this diagnosed? This condition is diagnosed with a physical exam.  In some cases, you may have a sample of tissue (biopsy) taken from your cyst to be examined under a microscope or tested for bacteria.  You may be referred to a health care provider who specializes in skin care (dermatologist). How is this treated? In many cases, epidermal cysts go away on their own without treatment. If a cyst becomes infected, treatment may include:  Opening and draining the cyst, done by a health care provider. After draining, minor surgery to  remove the rest of the cyst may be done.  Antibiotic medicine.  Injections of medicines (steroids) that help to reduce inflammation.  Surgery to remove the cyst. Surgery may be done if the cyst: ? Becomes large. ? Bothers you. ? Has a chance of turning into cancer.  Do not try to open a cyst yourself. Follow these instructions at home:  Take over-the-counter and prescription medicines only as told by your health care provider.  If you were prescribed an antibiotic medicine, take it it as told by your health care provider. Do not stop using the antibiotic even if you start to feel better.  Keep the area around your cyst clean and dry.  Wear loose, dry clothing.  Avoid touching your cyst.  Check your cyst every day for signs of infection. Check for: ? Redness, swelling, or pain. ? Fluid or blood. ? Warmth. ? Pus or a bad smell.  Keep all follow-up visits as told by your health care provider. This is important. How is this prevented?  Wear clean, dry, clothing.  Avoid wearing tight clothing.  Keep your skin clean and dry. Take showers or baths every day. Contact a health care provider if:  Your cyst develops symptoms of infection.  Your condition is not improving or is getting worse.  You develop a cyst that looks different from other cysts you have had.  You have a fever. Get help right away if:  Redness spreads from the cyst into the surrounding area. Summary  An epidermal cyst is a sac made of skin tissue. These cysts are   usually harmless (benign), and they may not cause symptoms unless they become infected.  If a cyst becomes infected, treatment may include surgery to open and drain the cyst, or to remove it. Treatment may also include medicines by mouth or through an injection.  Take over-the-counter and prescription medicines only as told by your health care provider. If you were prescribed an antibiotic medicine, take it as told by your health care  provider. Do not stop using the antibiotic even if you start to feel better.  Contact a health care provider if your condition is not improving or is getting worse.  Keep all follow-up visits as told by your health care provider. This is important. This information is not intended to replace advice given to you by your health care provider. Make sure you discuss any questions you have with your health care provider. Document Released: 12/23/2003 Document Revised: 05/14/2018 Document Reviewed: 08/04/2017 Elsevier Patient Education  2020 [employer].  Epidermal Cyst Removal  Epidermal cyst removal is a procedure to remove a sac of oily material (keratin) that has formed under your skin (epidermal cyst). Epidermal cysts may also be called epidermoid cysts, sebaceous cysts, or keratin cysts. Normally, the skin secretes this oily material through a gland or a hair follicle. However, when a skin gland or hair follicle becomes blocked, an epidermal cyst can form. You may need this procedure if you have an epidermal cyst that becomes large, uncomfortable, or infected. Tell a health care provider about:  Any allergies you have.  All medicines you are taking, including vitamins, herbs, eye drops, creams, and over-the-counter medicines.  Any problems you or family members have had with anesthetic medicines.  Any blood disorders you have.  Any surgeries you have had.  Any medical conditions you have now or have had.  Whether you are pregnant or may be pregnant. What are the risks? Generally, this is a safe procedure. However, problems may occur, including:  Development of another cyst.  Bleeding.  Infection.  Scarring. What happens before the procedure?  Ask your health care provider about: ? Changing or stopping your regular medicines. This is especially important if you are taking diabetes medicines or blood thinners. ? Taking medicines such as aspirin and ibuprofen. These medicines  can thin your blood. Do not take these medicines unless your health care provider tells you to take them. ? Taking over-the-counter medicines, vitamins, herbs, and supplements.  If you have an infected cyst, you may have to take antibiotic medicine before the cyst removal. Take your antibiotic as told by your health care provider. Do not stop taking the antibiotic even if you start to feel better.  Take a shower on the morning of your procedure. Your health care provider may ask you to use a germ-killing soap. What happens during the procedure?   You will be given a medicine to numb the area (local anesthetic).  The skin around the cyst will be cleaned with a germ-killing solution.  The health care provider will make a small incision in your skin over the cyst.  The health care provider will separate the cyst from the surrounding tissues that are under your skin.  If possible, the cyst will be removed undamaged (intact).  If the cyst bursts (ruptures), it will be removed in pieces.  After the cyst is removed, the health care provider will control any bleeding and close the incision with small stitches (sutures). Small incisions may not need sutures, and the bleeding will be controlled  by applying direct pressure with gauze.  The health care provider may apply antibiotic ointment and a bandage (dressing) over the incision. The procedure may vary among health care providers and hospitals. What happens after the procedure?  If your cyst ruptured during the procedure, you may need an antibiotic. If you are prescribed an antibiotic medicine or ointment, take or apply it as told by your health care provider. Do not stop using the antibiotic even if you start to feel better. Summary  Epidermal cyst removal is a procedure to remove a sac of oily material (keratin) that has formed under your skin (epidermal cyst).  You may need this procedure if you have an epidermal cyst that becomes large,  uncomfortable, or infected.  The health care provider will make a small incision in your skin to remove the cyst.  If you are prescribed an antibiotic medicine before the procedure, after the procedure, or both, use the antibiotic as told by your health care provider. Do not stop using the antibiotic even if you start to feel better. This information is not intended to replace advice given to you by your health care provider. Make sure you discuss any questions you have with your health care provider. Document Released: 01/19/2000 Document Revised: 05/14/2018 Document Reviewed: 11/14/2016 Elsevier Patient Education  2020 [employer].

## 2019-01-24 NOTE — H&P (Signed)
[facility] History and Physical  Reason for Referral: Cyst on neck  Referring Physician: Perlie Mayo, NP      Chief Complaint    Cyst      Andrew Waller is a 23 y.o. male.  HPI: Andrew. Waller is a very sweet 23 yo who has a history of trauma to his skin of the left neck after carry pieces of wood last year and having two pieces of wood pinch his neck skin. Since that time he has had a swollen lump that has never enlarged or drained or become infected, but has persisted.  It is mobile and superficial. He denies any pain but says that it does bother him when he is exercising and his clothes rub against it.  He reports that he has had some personal issues in the last few months that have caused him some anxiety, and he was prescribed medication for this, but has not been taking them due to have the medications made him feel.        Past Medical History:  Diagnosis Date  . Anxiety     History reviewed. No pertinent surgical history.       Family History  Problem Relation Age of Onset  . Diabetes Mother   . Sleep apnea Mother   . Hypertension Mother   . Obesity Mother   . Hypertension Father     Social History        Tobacco Use  . Smoking status: Former Smoker    Types: Cigarettes    Quit date: 02/06/2017    Years since quitting: 1.9  . Smokeless tobacco: Former Network engineer Use Topics  . Alcohol use: Not Currently  . Drug use: Yes    Types: Marijuana    Medications: I have reviewed the patient's current medications. No taking the medications below.  Allergies as of 01/21/2019   No Known Allergies        Medication List       Accurate as of January 21, 2019 11:59 PM. If you have any questions, ask your nurse or doctor.        busPIRone 5 MG tablet Commonly known as: BUSPAR Take 1 tablet (5 mg total) by mouth 3 (three) times daily.   escitalopram 10 MG tablet Commonly known as: LEXAPRO Take 1  tablet (10 mg total) by mouth daily.        ROS:  A comprehensive review of systems was negative except for: small lump on lower left neck  Blood pressure 126/84, pulse 69, temperature 98.3 F (36.8 C), temperature source Oral, resp. rate 16, height 5\' 9"  (1.753 m), weight 161 lb (73 kg), SpO2 98 %. Physical Exam Vitals reviewed.  Constitutional:      Appearance: Normal appearance. He is normal weight.  HENT:     Head: Normocephalic and atraumatic.     Nose: Nose normal.     Mouth/Throat:     Mouth: Mucous membranes are moist.  Eyes:     Extraocular Movements: Extraocular movements intact.     Pupils: Pupils are equal, round, and reactive to light.  Neck:     Comments: 1cm superficial lump on the left lower neck just above the clavicular head Cardiovascular:     Rate and Rhythm: Normal rate and regular rhythm.  Pulmonary:     Effort: Pulmonary effort is normal.     Breath sounds: Normal breath sounds.  Abdominal:     General: There is no distension.  Palpations: Abdomen is soft.     Tenderness: There is no abdominal tenderness.  Musculoskeletal:        General: No swelling. Normal range of motion.     Cervical back: Normal range of motion. No rigidity.  Skin:    General: Skin is warm and dry.  Neurological:     General: No focal deficit present.     Mental Status: He is alert and oriented to person, place, and time.  Psychiatric:        Mood and Affect: Mood normal.        Behavior: Behavior normal.        Thought Content: Thought content normal.        Judgment: Judgment normal.     Results: Bedside US- small superficial cyst like area above the muscle in the left neck, 1cm in size   Assessment & Plan:  Andrew Waller is a 23 y.o. male with a superficial cyst like from the trauma with the skin being pinched.  The area is bothersome when he exercises and he would like to have it removed. Discussed superficial nature and ability to remove under local.  Discussed ellipse of skin to remove, discussed risk of bleeding, infection, recurrence and sutures that would be removed in about 10-14 days.   -Minor room for excision of cyst of the left neck   All questions were answered to the satisfaction of the patient.   Andrew Waller 01/24/2019, 11:35 AM

## 2019-01-27 ENCOUNTER — Ambulatory Visit: Payer: BC Managed Care – PPO | Admitting: Family Medicine

## 2019-01-27 ENCOUNTER — Ambulatory Visit (HOSPITAL_COMMUNITY): Admission: RE | Admit: 2019-01-27 | Payer: BC Managed Care – PPO | Source: Ambulatory Visit | Admitting: General Surgery

## 2019-01-27 ENCOUNTER — Other Ambulatory Visit: Payer: Self-pay

## 2019-01-27 ENCOUNTER — Encounter (HOSPITAL_COMMUNITY): Admission: RE | Payer: Self-pay | Source: Ambulatory Visit

## 2019-01-27 SURGERY — MINOR EXCISION LIPOMA
Anesthesia: LOCAL | Laterality: Left

## 2019-02-03 ENCOUNTER — Encounter: Payer: Self-pay | Admitting: Family Medicine

## 2019-02-03 ENCOUNTER — Encounter: Payer: BC Managed Care – PPO | Admitting: Family Medicine

## 2019-02-03 ENCOUNTER — Other Ambulatory Visit: Payer: Self-pay

## 2019-02-04 NOTE — Progress Notes (Signed)
  This encounter was created in error - please disregard. Pt not seen

## 2019-05-12 ENCOUNTER — Ambulatory Visit (INDEPENDENT_AMBULATORY_CARE_PROVIDER_SITE_OTHER): Payer: BC Managed Care – PPO | Admitting: Family Medicine

## 2019-05-12 ENCOUNTER — Encounter: Payer: Self-pay | Admitting: Family Medicine

## 2019-05-12 ENCOUNTER — Other Ambulatory Visit: Payer: Self-pay

## 2019-05-12 VITALS — BP 104/72 | HR 82 | Temp 98.2°F | Resp 15 | Ht 69.0 in | Wt 189.0 lb

## 2019-05-12 DIAGNOSIS — M25511 Pain in right shoulder: Secondary | ICD-10-CM | POA: Insufficient documentation

## 2019-05-12 DIAGNOSIS — L720 Epidermal cyst: Secondary | ICD-10-CM

## 2019-05-12 DIAGNOSIS — M25512 Pain in left shoulder: Secondary | ICD-10-CM

## 2019-05-12 MED ORDER — METHYLPREDNISOLONE ACETATE 80 MG/ML IJ SUSP
80.0000 mg | Freq: Once | INTRAMUSCULAR | Status: AC
  Administered 2019-05-12: 16:00:00 80 mg via INTRAMUSCULAR

## 2019-05-12 MED ORDER — KETOROLAC TROMETHAMINE 60 MG/2ML IM SOLN
60.0000 mg | Freq: Once | INTRAMUSCULAR | Status: AC
  Administered 2019-05-12: 60 mg via INTRAMUSCULAR

## 2019-05-12 NOTE — Patient Instructions (Signed)
I appreciate the opportunity to provide you with care for your health and wellness. Today we discussed: shoulder pain  Follow up: 6 months   No labs   Referral for surgery made   Work note for 1 week, call if not better, we can extend it by another week NO LIFTING more than 10 pounds Bend down to pick up son. Avoid rowing machine for at least 1 month. Watch videos on shoulder strengthening exercises. Injections for pain today  Please continue to practice social distancing to keep you, your family, and our community safe.  If you must go out, please wear a mask and practice good handwashing.  It was a pleasure to see you and I look forward to continuing to work together on your health and well-being. Please do not hesitate to call the office if you need care or have questions about your care.  Have a wonderful day and week. With Gratitude, Cherly Beach, DNP, AGNP-BC

## 2019-05-12 NOTE — Assessment & Plan Note (Signed)
Educated on rowing and shoulder stabilizing moves. Injections of Toradol and Depo-Medrol today in office.  Advised for continued use of ibuprofen and Tylenol as needed.  1 week work note provided.  Encouraged not to lift above 10 pounds.  And to lift while bending and not pull on his shoulders if he can help it.  If needed will do physical therapy and/or referral to Ortho

## 2019-05-12 NOTE — Progress Notes (Signed)
Subjective:  Patient ID: Andrew Waller, male    DOB: Aug 10, 1995  Age: 24 y.o. MRN: [id number]  CC:  Chief Complaint  Patient presents with  . Shoulder Pain    x 2 weeks, at top of shoulder       HPI  HPI Bilateral shoulder pain, started about 2 weeks ago with rowing.  He was using a rowing machine at the gym and a day or 2 after that he noticed some discomfort.  Have a little bit of discomfort with actually lifting and rowing in the machine but not enough to make it stop growing.  Predominantly more on the left than the right.  He describes as an ache sometimes sharp and some weakness.  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  He has been using Tylenol and that does help tremendously but he is worried that if he continues to work at this time or go to the gym that he might make things worse.  He reports that he does have a dull ache all the time and definitely hurts with raising his arm and definitely feels a sharp pain with that.  Is open to getting injections today in office.  And willing to continue use of Tylenol and ibuprofen.   Of note he would like to get a referral back for his epidermoid cyst of his neck as he reports that he went to rehab for Xanax abuse for 50 days out of [place].  He is doing much better reports that he is feeling better and overall is in a better situation than he was in.   Today patient denies signs and symptoms of COVID 19 infection including fever, chills, cough, shortness of breath, and headache. Past Medical, Surgical, Social History, Allergies, and Medications have been Reviewed.   Past Medical History:  Diagnosis Date  . Anxiety     No outpatient medications have been marked as taking for the 05/12/19 encounter (Office Visit) with Perlie Mayo, NP.    ROS:  Review of Systems  Constitutional: Negative.   HENT: Negative.   Eyes: Negative.   Respiratory: Negative.     Cardiovascular: Negative.   Gastrointestinal: Negative.   Genitourinary: Negative.   Musculoskeletal: Positive for joint pain.  Skin: Negative.   Neurological: Negative.   Endo/Heme/Allergies: Negative.   Psychiatric/Behavioral: Negative.   All other systems reviewed and are negative.    Objective:   Today's Vitals: BP 104/72   Pulse 82   Temp 98.2 F (36.8 C) (Temporal)   Resp 15   Ht 5\' 9"  (1.753 m)   Wt 189 lb (85.7 kg)   SpO2 98%   BMI 27.91 kg/m  Vitals with BMI 05/12/2019 01/21/2019 01/07/2019  Height 5\' 9"  5\' 9"  5\' 9"   Weight 189 lbs 161 lbs 160 lbs 2 oz  BMI 27.9 [id number] [id number]  Systolic 123456 [id number]  Diastolic 72 84 85  Pulse 82 69 63     Physical Exam Vitals and nursing note reviewed.  Constitutional:      Appearance: Normal appearance. He is obese.  HENT:     Head: Normocephalic and atraumatic.     Right Ear: External ear normal.     Left Ear: External ear normal.     Mouth/Throat:     Comments: Mask in place Eyes:     General:        Right eye: No discharge.  Left eye: No discharge.     Conjunctiva/sclera: Conjunctivae normal.  Cardiovascular:     Rate and Rhythm: Normal rate and regular rhythm.     Pulses: Normal pulses.     Heart sounds: Normal heart sounds.  Pulmonary:     Effort: Pulmonary effort is normal.     Breath sounds: Normal breath sounds.  Musculoskeletal:     Right shoulder: Tenderness present. No swelling or deformity. Normal range of motion. Normal strength. Normal pulse.     Left shoulder: Tenderness present. No swelling or deformity. Normal range of motion. Decreased strength. Normal pulse.     Cervical back: Normal range of motion and neck supple.     Comments: Negative Neer's test.  Negative Hawkins test, negative liftoff test, negative Jobe test, some discomfort with abduction and abduction of external and internal rotation.  Skin:    General: Skin is warm.  Neurological:     General: No focal deficit present.      Mental Status: He is alert and oriented to person, place, and time.  Psychiatric:        Mood and Affect: Mood normal.        Behavior: Behavior normal.        Thought Content: Thought content normal.        Judgment: Judgment normal.          Assessment   1. Acute pain of both shoulders   2. Epidermal cyst of neck     Tests ordered No orders of the defined types were placed in this encounter.    Plan: Please see assessment and plan per problem list above.   Meds ordered this encounter  Medications  . ketorolac (TORADOL) injection 60 mg  . methylPREDNISolone acetate (DEPO-MEDROL) injection 80 mg    Patient to follow-up in 6 months  Perlie Mayo, NP

## 2019-05-12 NOTE — Assessment & Plan Note (Signed)
Referral made again. Went to rehab so missed this the first time around.

## 2019-05-25 ENCOUNTER — Other Ambulatory Visit: Payer: Self-pay

## 2019-05-25 ENCOUNTER — Encounter: Payer: Self-pay | Admitting: General Surgery

## 2019-05-25 ENCOUNTER — Ambulatory Visit: Payer: BC Managed Care – PPO | Admitting: General Surgery

## 2019-05-25 VITALS — BP 145/70 | HR 50 | Temp 97.6°F | Resp 12 | Ht 69.0 in | Wt 193.0 lb

## 2019-05-25 DIAGNOSIS — L720 Epidermal cyst: Secondary | ICD-10-CM | POA: Diagnosis not present

## 2019-05-25 NOTE — Patient Instructions (Signed)
Plan to excise the cyst at the office on Thursday 06/03/2019.   Epidermal Cyst Removal  Epidermal cyst removal is a procedure to remove a sac of oily material (keratin) that has formed under your skin (epidermal cyst). Epidermal cysts may also be called epidermoid cysts, sebaceous cysts, or keratin cysts. Normally, the skin secretes this oily material through a gland or a hair follicle. However, when a skin gland or hair follicle becomes blocked, an epidermal cyst can form. You may need this procedure if you have an epidermal cyst that becomes large, uncomfortable, or infected. Tell a health care provider about:  Any allergies you have.  All medicines you are taking, including vitamins, herbs, eye drops, creams, and over-the-counter medicines.  Any problems you or family members have had with anesthetic medicines.  Any blood disorders you have.  Any surgeries you have had.  Any medical conditions you have now or have had.  Whether you are pregnant or may be pregnant. What are the risks? Generally, this is a safe procedure. However, problems may occur, including:  Development of another cyst.  Bleeding.  Infection.  Scarring. What happens before the procedure?  Take a shower on the morning of your procedure. Your health care provider may ask you to use a germ-killing soap. What happens during the procedure?   You will be given a medicine to numb the area (local anesthetic).  The skin around the cyst will be cleaned with a germ-killing solution.  The health care provider will make a small incision in your skin over the cyst.  The health care provider will separate the cyst from the surrounding tissues that are under your skin.  If possible, the cyst will be removed undamaged (intact).  If the cyst bursts (ruptures), it will be removed in pieces.  After the cyst is removed, the health care provider will control any bleeding and close the incision with small stitches  (sutures). Small incisions may not need sutures, and the bleeding will be controlled by applying direct pressure with gauze.  The health care provider may apply antibiotic ointment and a bandage (dressing) over the incision. The procedure may vary among health care providers and hospitals. What happens after the procedure?  If your cyst ruptured during the procedure, you may need an antibiotic. If you are prescribed an antibiotic medicine or ointment, take or apply it as told by your health care provider. Do not stop using the antibiotic even if you start to feel better. Summary  Epidermal cyst removal is a procedure to remove a sac of oily material (keratin) that has formed under your skin (epidermal cyst).  You may need this procedure if you have an epidermal cyst that becomes large, uncomfortable, or infected.  The health care provider will make a small incision in your skin to remove the cyst.  If you are prescribed an antibiotic medicine before the procedure, after the procedure, or both, use the antibiotic as told by your health care provider. Do not stop using the antibiotic even if you start to feel better. This information is not intended to replace advice given to you by your health care provider. Make sure you discuss any questions you have with your health care provider. Document Revised: 05/14/2018 Document Reviewed: 11/14/2016 Elsevier Patient Education  [place].

## 2019-05-25 NOTE — Progress Notes (Signed)
[facility] Note   HPI:  24 y.o. Male presents to clinic for follow-up evaluation of his left neck cyst. He was seen in December and is ready to get this removed. He has no changes to his health.   Review of Systems:  No pain or redness at sight All other review of systems: otherwise negative   Vital Signs:  BP (!) 145/70   Pulse (!) 50   Temp 97.6 F (36.4 C) (Oral)   Resp 12   Ht 5\' 9"  (1.753 m)   Wt 193 lb (87.5 kg)   SpO2 99%   BMI 28.50 kg/m    Physical Exam:  Physical Exam Constitutional:      Appearance: He is normal weight.  HENT:     Head: Normocephalic.     Nose: Nose normal.     Mouth/Throat:     Mouth: Mucous membranes are moist.  Eyes:     Pupils: Pupils are equal, round, and reactive to light.  Neck:     Comments: 1cm superficial cyst lesion on left lower neck Cardiovascular:     Rate and Rhythm: Normal rate.  Pulmonary:     Effort: Pulmonary effort is normal.  Abdominal:     General: There is no distension.     Palpations: Abdomen is soft.     Tenderness: There is no abdominal tenderness.  Musculoskeletal:        General: No swelling. Normal range of motion.     Cervical back: Normal range of motion. No rigidity.  Skin:    General: Skin is warm and dry.  Neurological:     General: No focal deficit present.     Mental Status: He is alert and oriented to person, place, and time.    Assessment:  24 y.o. yo Male with small superficial cyst on his neck. He would like to have this removed. It occurred after carry pieces of wood and the wood pinching his neck skin together.   Plan:  Excision of cyst in the office Thursday. Discussed risk of bleeding, infection, recurrence, need for sutures to remain in place. He has opted to proceed with local in the office.   Future Appointments  Date Time Provider [place]  05/27/2019  3:45 PM Virl Cagey, MD [facility] None  11/17/2019  3:20 PM Perlie Mayo, NP [facility] RPC     All of the above recommendations were discussed with the patient, and all of patient's questions were answered to his expressed satisfaction.  Curlene Labrum, MD [facility] [address] [place], [place] [address] [phone] (office)

## 2019-05-27 ENCOUNTER — Other Ambulatory Visit (HOSPITAL_COMMUNITY)
Admission: RE | Admit: 2019-05-27 | Discharge: 2019-05-27 | Disposition: A | Discharge status: Home / Self Care | Payer: BC Managed Care – PPO | Source: Ambulatory Visit | Attending: General Surgery | Admitting: General Surgery

## 2019-05-27 ENCOUNTER — Ambulatory Visit (INDEPENDENT_AMBULATORY_CARE_PROVIDER_SITE_OTHER): Payer: BC Managed Care – PPO | Admitting: General Surgery

## 2019-05-27 ENCOUNTER — Other Ambulatory Visit: Payer: Self-pay

## 2019-05-27 ENCOUNTER — Encounter: Payer: Self-pay | Admitting: General Surgery

## 2019-05-27 VITALS — BP 139/72 | HR 62 | Temp 97.7°F | Resp 12 | Ht 69.0 in | Wt 188.0 lb

## 2019-05-27 DIAGNOSIS — L720 Epidermal cyst: Secondary | ICD-10-CM | POA: Insufficient documentation

## 2019-05-27 MED ORDER — OXYCODONE HCL 5 MG PO TABS: 5.0000 mg | ORAL_TABLET | ORAL | 0 refills | Status: DC | PRN

## 2019-05-27 NOTE — Patient Instructions (Addendum)
Return to work on 05/31/2019. Activity as tolerated but limit stretch or pull on stitches. Tylenol and ibuprofen for pain control. Roxicodone 5 mg every 4-6 hrs as needed for break through pain. Will call to check on you in two weeks. Do not pick at glue or put ointments or lotion on the glue. Ok to shower but do not take hot showers that will dissolve the glue.    Epidermal Cyst Removal, Care After This sheet gives you information about how to care for yourself after your procedure. Your health care provider may also give you more specific instructions. If you have problems or questions, contact your health care provider. What can I expect after the procedure? After the procedure, it is common to have:  Soreness in the area where your cyst was removed.  Tightness or itchiness from the stitches (sutures) in your skin. Follow these instructions at home: Medicines  Take over-the-counter and prescription medicines only as told by your health care provider.  If you were prescribed an antibiotic medicine or ointment, take or apply it as told by your health care provider. Do not stop using the antibiotic even if you start to feel better. Incision care   Follow instructions from your health care provider about how to take care of your incision. Make sure you: ? Wash your hands with soap and water before you change your bandage (dressing). If soap and water are not available, use hand sanitizer. ? Change your dressing as told by your health care provider. ? Leave sutures, skin glue, or adhesive strips in place. These skin closures may need to stay in place for 1-2 weeks or longer. If adhesive strip edges start to loosen and curl up, you may trim the loose edges. Do not remove adhesive strips completely unless your health care provider tells you to do that.  Keep the dressingdry until your health care provider says that it can be removed.  After your dressing is off, check your incision area  every day for signs of infection. Check for: ? Redness, swelling, or pain. ? Fluid or blood. ? Warmth. ? Pus or a bad smell. General instructions  Do not take baths, swim, or use a hot tub until your health care provider approves.   Ok to shower.   Your health care provider may ask you to avoid contact sports or activities that take a lot of effort. Do not do anything that stretches or puts pressure on your incision.  You can return to your normal diet.  Keep all follow-up visits as told by your health care provider. This is important. Contact a health care provider if:  You have a fever.  You have redness, swelling, or pain in the incision area.  You have fluid or blood coming from your incision.  You have pus or a bad smell coming from your incision.  Your incision feels warm to the touch.  Your cyst grows back. Summary  After the procedure, it is common to have soreness in the area where your cyst was removed.  Take or apply over-the-counter and prescription medicines only as told by your health care provider.  Follow instructions from your health care provider about how to take care of your incision. This information is not intended to replace advice given to you by your health care provider. Make sure you discuss any questions you have with your health care provider. Document Revised: 05/13/2017 Document Reviewed: 11/14/2016 Elsevier Patient Education  [place].

## 2019-05-27 NOTE — Progress Notes (Signed)
[facility] Procedure Note  05/27/19  Preoperative Diagnosis:  Left neck cyst    Postoperative Diagnosis: Same   Procedure(s) Performed: Excision of cyst (1cm)    Surgeon: Lanell Matar. Constance Haw, MD   Assistants: No qualified resident was available    Anesthesia: 1% lidocaine     Specimens: Cyst    Estimated Blood Loss: Minimal   Blood Replacement: None    Complications: None   Wound Class: Clean   Operative Indications:  Mr. Sudbeck is a 24 yo with a history of a traumatic left neck cyst after wood fell on his neck.  We discussed the risk of excision including bleeding, infection, recurrence, and he opted to proceed.    Procedure: The patient was taken to the procedure room. The left neck was prepped and draped and 1% lidocaine was injected around the area. An elliptical incision was made and carried down through the skin to the subcutaneous tissue with a scalpel. The 1cm cyst was removed in its entirety and sent to pathology. The area was made hemostatic. The deep layer was closed with 3- Vicryl interrrupted and the skin was closed with a running 4-0 Monocryl and dermabond.   Final inspection revealed acceptable hemostasis. All counts were correct at the end of the case. The patient tolerated the procedure well.    Curlene Labrum, MD [facility] [place], [place] [address] [phone] (office)  Post operative phone call:   Future Appointments  Date Time Provider [place]  06/10/2019  3:45 PM Virl Cagey, MD [facility] None  11/17/2019  3:20 PM Perlie Mayo, NP [facility] RPC

## 2019-05-31 LAB — SURGICAL PATHOLOGY

## 2019-06-02 ENCOUNTER — Telehealth (INDEPENDENT_AMBULATORY_CARE_PROVIDER_SITE_OTHER): Payer: Self-pay | Admitting: General Surgery

## 2019-06-02 DIAGNOSIS — D3611 Benign neoplasm of peripheral nerves and autonomic nervous system of face, head, and neck: Secondary | ICD-10-CM

## 2019-06-02 NOTE — Telephone Encounter (Signed)
[facility]  Pathology: FINAL MICROSCOPIC DIAGNOSIS:   A. CYST OF NECK, CYSTECTOMY:  SCHWANNOMA. PERIPHERAL MARGIN INVOLVED.   MICROSCOPIC DESCRIPTION: There is a proliferation of fairly uniform spindle shaped cells with variable cellularity and Verocay bodies. There is no appreciable atypia. This is a neurilemmoma or schwannoma. One peripheral margin is focally involved.   GROSS DESCRIPTION:   Received in formalin is a 1.7 x 1.0 cm portion of skin and subcutaneous tissue excised to a depth of up to 0.7 cm. There is dense subcutaneous fibrosis. An intact cyst is not grossly identified. The specimen is sectioned and entirely submitted in 1 cassette.    Erby made aware of pathology of benign schwannoma and potential for recurrence due to the very small focal dermal margin that is positive. I talked to Dr. Nira Retort, pathology, and confirmed it was the dermal margin but unsure if this is superior/ inferior as we did not orient the small specimen thought to be a cyst originally.  I also called Dr. Redmond Baseman, ENT to discuss the findings and get his opinion. He agrees risk of coming back but no need for further workup and lesion is benign.    Offered Choice option to see if this recurs and it could be years prior to it recurring versus further excision of the skin, but this would be another procedure and a larger excision.  At this time he will hold on further excision. Will call 5/6 to check on his healing.   Curlene Labrum, MD [facility] [address] [place], [place] [address] [phone] (office)

## 2019-06-10 ENCOUNTER — Telehealth (INDEPENDENT_AMBULATORY_CARE_PROVIDER_SITE_OTHER): Payer: BC Managed Care – PPO | Admitting: General Surgery

## 2019-06-10 DIAGNOSIS — D3611 Benign neoplasm of peripheral nerves and autonomic nervous system of face, head, and neck: Secondary | ICD-10-CM

## 2019-06-10 NOTE — Progress Notes (Signed)
[facility]  Attempted to call mobile number. Did not get answer or voicemail. Received notification that call could not be completed at this time.  [phone]. Tried to call his mother [phone] and this was not her number.   Will try to get in touch with him tomorrow.  Curlene Labrum, MD [facility] [address] [place],  [address] [phone] (office)

## 2019-11-17 ENCOUNTER — Ambulatory Visit: Payer: BC Managed Care – PPO | Admitting: Family Medicine

## 2020-03-02 ENCOUNTER — Ambulatory Visit (INDEPENDENT_AMBULATORY_CARE_PROVIDER_SITE_OTHER): Payer: Self-pay

## 2020-03-02 ENCOUNTER — Ambulatory Visit
Admission: EM | Admit: 2020-03-02 | Discharge: 2020-03-02 | Disposition: A | Discharge status: Home / Self Care | Payer: Self-pay | Attending: Emergency Medicine | Admitting: Emergency Medicine

## 2020-03-02 ENCOUNTER — Encounter: Payer: Self-pay | Admitting: Emergency Medicine

## 2020-03-02 ENCOUNTER — Other Ambulatory Visit: Payer: Self-pay

## 2020-03-02 DIAGNOSIS — Z20822 Contact with and (suspected) exposure to covid-19: Secondary | ICD-10-CM

## 2020-03-02 DIAGNOSIS — R0789 Other chest pain: Secondary | ICD-10-CM

## 2020-03-02 DIAGNOSIS — R079 Chest pain, unspecified: Secondary | ICD-10-CM

## 2020-03-02 DIAGNOSIS — B349 Viral infection, unspecified: Secondary | ICD-10-CM

## 2020-03-02 MED ORDER — ONDANSETRON 8 MG PO TBDP: ORAL_TABLET | ORAL | 0 refills | Status: DC

## 2020-03-02 MED ORDER — IBUPROFEN 600 MG PO TABS: 600.0000 mg | ORAL_TABLET | Freq: Four times a day (QID) | ORAL | 0 refills | Status: DC | PRN

## 2020-03-02 MED ORDER — FLUTICASONE PROPIONATE 50 MCG/ACT NA SUSP: 2.0000 | Freq: Every day | NASAL | 0 refills | Status: DC

## 2020-03-02 NOTE — ED Provider Notes (Signed)
HPI  SUBJECTIVE:  Andrew Waller is a 25 y.o. male who presents with fevers 101, body aches, headaches, rhinorrhea, shortness of breath, nausea and 2 episodes of emesis starting last night.  States that his sense of smell and taste are "off".  Thinks that they are "going away".  No nasal congestion, sore throat, cough, abdominal pain.  He also reports sharp left sided chest pain under his axilla with deep inspiration/expiration.  He states this is causing his shortness of breath.  No trauma to the chest.  No calf cramping, pain, swelling.  No immobilization, surgery in the past weeks, hemoptysis.  No change in his physical activity-patient states that he stopped working out at the beginning of this year.  No known Covid exposure.  He got the second dose of the Pfizer vaccine in August. No Antipyretic in the past 6 hours.  He has not tried anything for this.  No alleviating factors.  His chest pain is worse with inspiration/expiration.  Past medical history negative for pneumothorax, cancer, PE, DVT.  NIO:EVOJJ, Barbee Cough, NP   Past Medical History:  Diagnosis Date  . Anxiety     History reviewed. No pertinent surgical history.  Family History  Problem Relation Age of Onset  . Diabetes Mother   . Sleep apnea Mother   . Hypertension Mother   . Obesity Mother   . Hypertension Father     Social History   Tobacco Use  . Smoking status: Former Smoker    Types: Cigarettes    Quit date: 02/06/2017    Years since quitting: 3.0  . Smokeless tobacco: Former Network engineer  . Vaping Use: Every day  Substance Use Topics  . Alcohol use: Not Currently  . Drug use: Yes    Types: Marijuana    No current facility-administered medications for this encounter.  Current Outpatient Medications:  .  fluticasone (FLONASE) 50 MCG/ACT nasal spray, Place 2 sprays into both nostrils daily., Disp: 16 g, Rfl: 0 .  ibuprofen (ADVIL) 600 MG tablet, Take 1 tablet (600 mg total) by mouth every 6 (six) hours  as needed., Disp: 30 tablet, Rfl: 0 .  ondansetron (ZOFRAN ODT) 8 MG disintegrating tablet, 1/2- 1 tablet q 8 hr prn nausea, vomiting, Disp: 20 tablet, Rfl: 0  No Known Allergies   ROS  As noted in HPI.   Physical Exam  BP (!) 147/95 (BP Location: Right Arm)   Pulse 67   Temp 98.7 F (37.1 C) (Oral)   Resp 16   Ht 5\' 9"  (1.753 m)   Wt 81.6 kg   SpO2 100%   BMI 26.58 kg/m   Constitutional: Well developed, well nourished, no acute distress Eyes:  EOMI, conjunctiva normal bilaterally HENT: Normocephalic, atraumatic,mucus membranes moist.  No sinus tenderness.  Mild nasal congestion. Respiratory: Normal inspiratory effort, lungs clear bilaterally.  Positive tenderness under the left axilla.  No other tenderness over the rest of the chest. Cardiovascular: Normal rate no murmurs rubs or gallops GI: nondistended skin: No rash, skin intact Musculoskeletal: Posterior right popliteal tenderness.  Left calf nontender.  Calves symmetric, no edema Neurologic: Alert & oriented x 3, no focal neuro deficits Psychiatric: Speech and behavior appropriate   ED Course   Medications - No data to display  Orders Placed This Encounter  Procedures  . Covid-19, Flu A+B ([facility])    Standing Status:   Standing    Number of Occurrences:   1  . DG Chest 2 View  Standing Status:   Standing    Number of Occurrences:   1    Order Specific Question:   Reason for Exam (SYMPTOM  OR DIAGNOSIS REQUIRED)    Answer:   l sided axaillry cp with inspiration r/o PTX,  . ED EKG    Standing Status:   Standing    Number of Occurrences:   1    Order Specific Question:   Reason for Exam    Answer:   Chest Pain    No results found for this or any previous visit (from the past 24 hour(s)). DG Chest 2 View  Result Date: 03/02/2020 CLINICAL DATA:  Left side chest pain EXAM: CHEST - 2 VIEW COMPARISON:  07/07/2017 FINDINGS: The heart size and mediastinal contours are within normal limits. Both lungs are  clear. The visualized skeletal structures are unremarkable. IMPRESSION: Normal study. Electronically Signed   By: Rolm Baptise M.D.   On: 03/02/2020 17:57    ED Clinical Impression  1. Viral illness   2. Exposure to COVID-19 virus   3. Chest pain, unspecified type   4. Encounter for laboratory testing for COVID-19 virus      ED Assessment/Plan   COVID sent. will obtain CXR, EKG because of the chest pain.  D-dimer is not available here.  Pt does not meet PERC criteria due to cal tenderness. will treat this symptomatically and have instructed the patient to go to the ER if his calf pain, chest pain or shortness of breath gets worse for PE work-up.  He agrees with plan  EKG: Bradycardia with sinus arrhythmia, rate 51.  Normal axis, normal intervals.  No hypertrophy.  No ST elevation.  Unable to find previous EKG for comparison  Reviewed imaging independently.  Normal chest x-ray. see radiology report for full details.  Suspect COVID.  Will send home with Tylenol/ibuprofen, Flonase, saline nasal irrigation,  Zofran.  His phone number [phone]  Discussed  imaging, MDM, treatment plan, and plan for follow-up with patient. Discussed sn/sx that should prompt return to the ED. patient agrees with plan.   Meds ordered this encounter  Medications  . fluticasone (FLONASE) 50 MCG/ACT nasal spray    Sig: Place 2 sprays into both nostrils daily.    Dispense:  16 g    Refill:  0  . ibuprofen (ADVIL) 600 MG tablet    Sig: Take 1 tablet (600 mg total) by mouth every 6 (six) hours as needed.    Dispense:  30 tablet    Refill:  0  . ondansetron (ZOFRAN ODT) 8 MG disintegrating tablet    Sig: 1/2- 1 tablet q 8 hr prn nausea, vomiting    Dispense:  20 tablet    Refill:  0    *This clinic note was created using [employer]. Therefore, there may be occasional mistakes despite careful proofreading.   ?    Melynda Ripple, MD 03/03/20 [phone]

## 2020-03-02 NOTE — Discharge Instructions (Addendum)
Your chest x-ray and EKG were normal.  This is very reassuring.  Your Covid test will be back in several days.  Ibuprofen 600 mg, 1000 mg Tylenol together 3 or 4 times a day as needed for pain, Flonase, saline nasal irrigation with a Milta Deiters Med rinse and distilled water as often as you want to help with the nasal congestion, Zofran as needed for nausea, vomiting.

## 2020-03-02 NOTE — ED Triage Notes (Signed)
Headache with fever since this morning.  Chest pain when taking a deep breath.

## 2020-03-03 LAB — COVID-19, FLU A+B NAA
Influenza A, NAA: NOT DETECTED
Influenza B, NAA: NOT DETECTED
SARS-CoV-2, NAA: NOT DETECTED

## 2021-07-22 IMAGING — DX DG CHEST 2V
2 series · 2 of 2 positions shown · non-contrast
Comparison: 07/07/2017

CLINICAL DATA: Left side chest pain

EXAM:
CHEST - 2 VIEW

[chest pa]
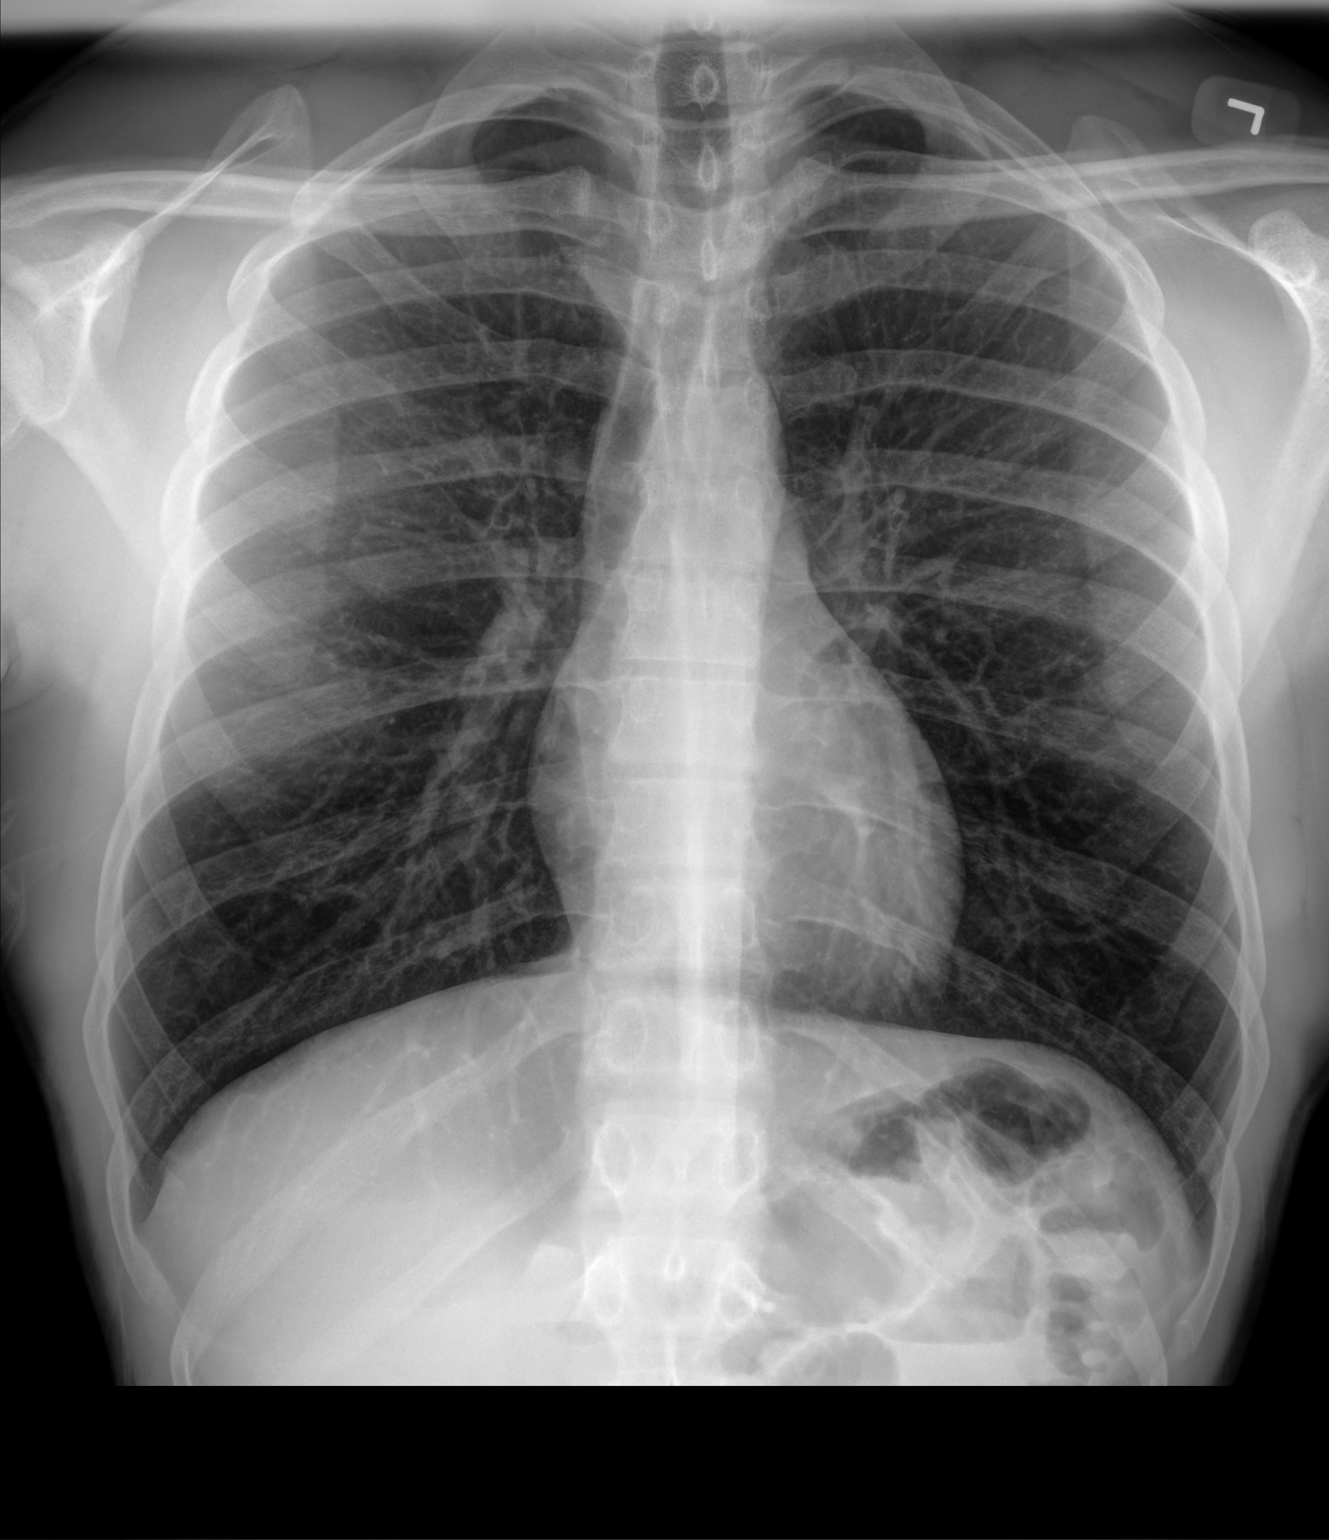

[chest lat]
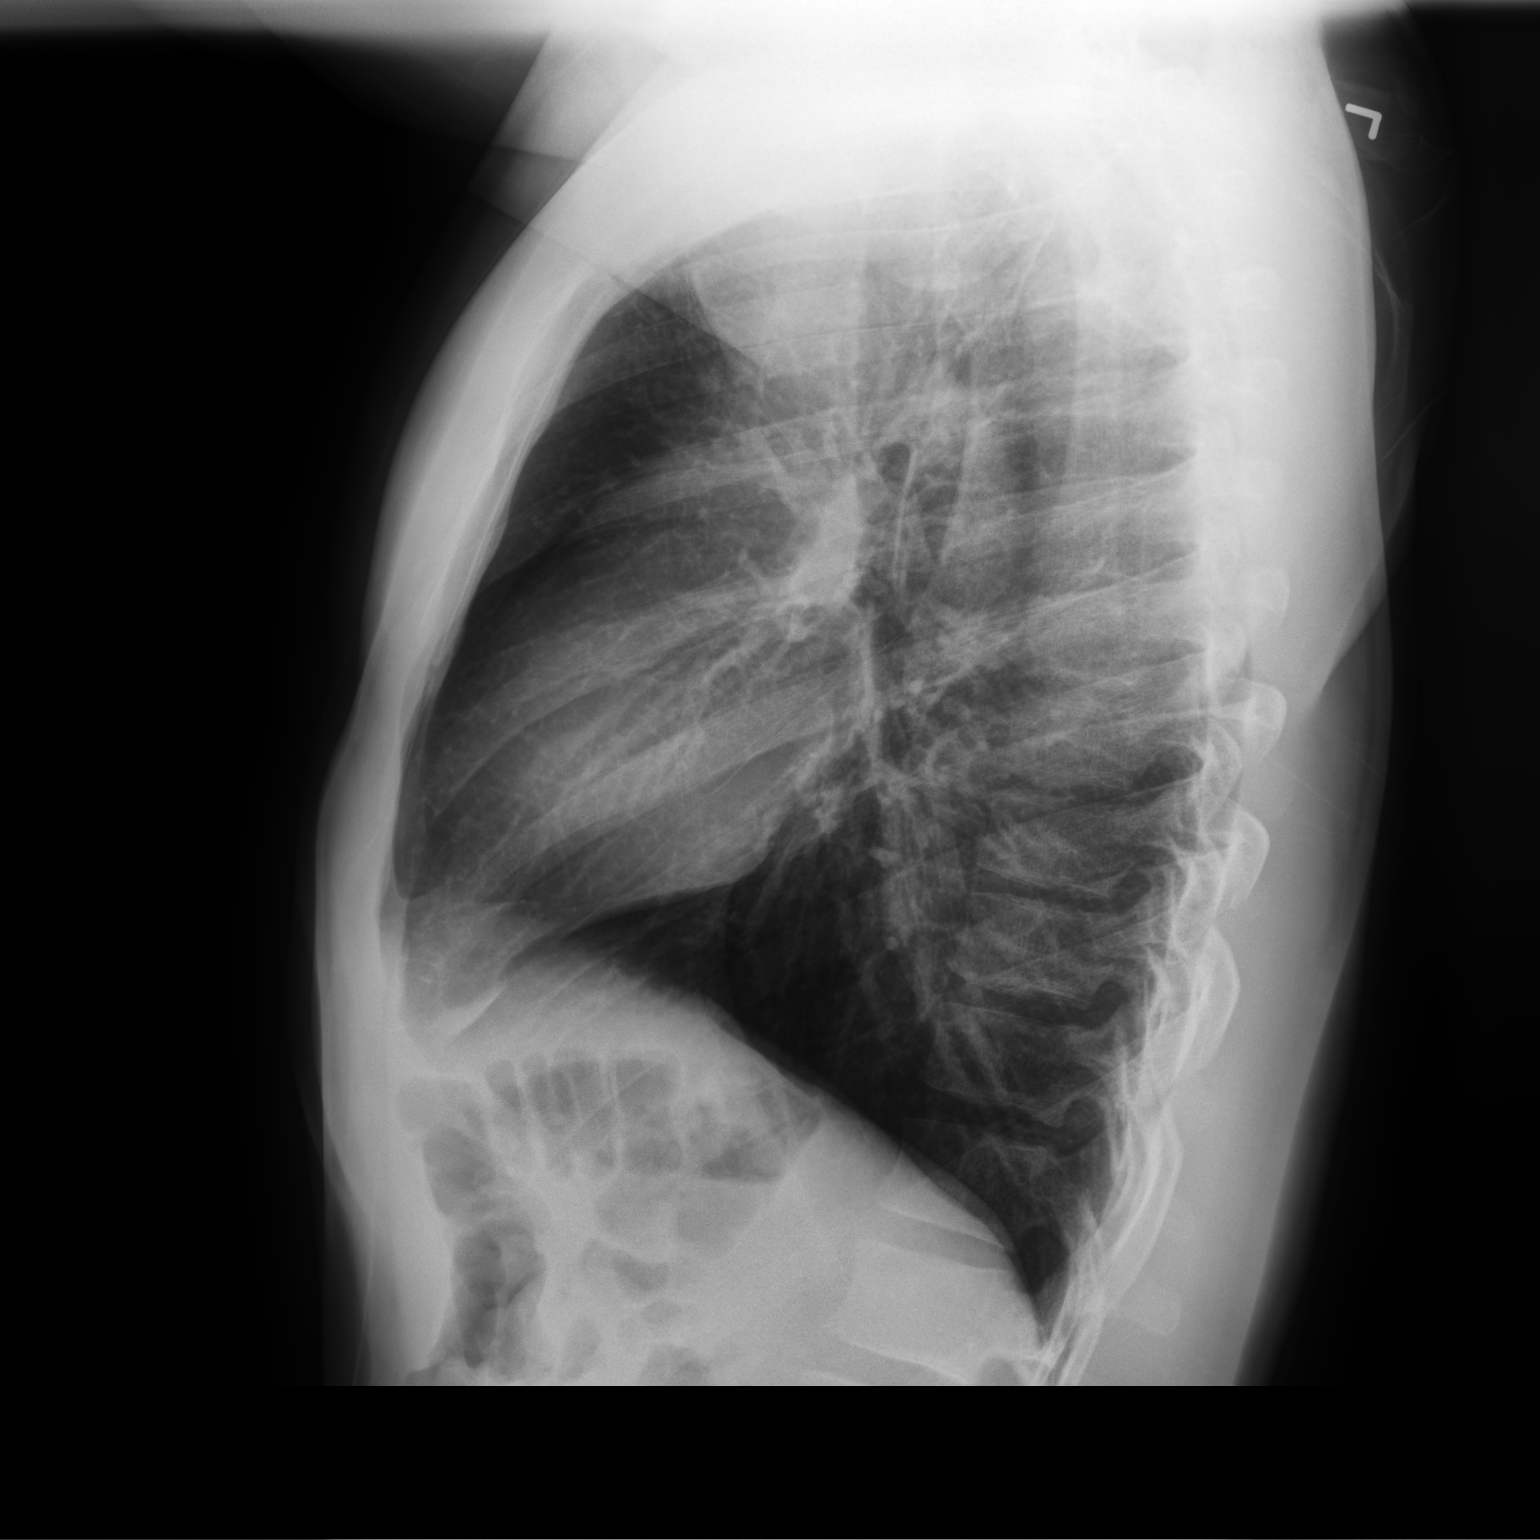

[2 of 2 positions shown; findings below may reference images not displayed]

FINDINGS: The heart size and mediastinal contours are within normal limits.
Both lungs are clear. The visualized skeletal structures are
unremarkable.
IMPRESSION: Normal study.

## 2021-10-01 ENCOUNTER — Other Ambulatory Visit: Payer: Self-pay

## 2021-10-01 ENCOUNTER — Emergency Department (HOSPITAL_COMMUNITY)
Admission: EM | Admit: 2021-10-01 | Discharge: 2021-10-01 | Disposition: A | Discharge status: Home / Self Care | Payer: Self-pay | Attending: Emergency Medicine | Admitting: Emergency Medicine

## 2021-10-01 ENCOUNTER — Emergency Department (HOSPITAL_COMMUNITY): Payer: Self-pay

## 2021-10-01 ENCOUNTER — Encounter (HOSPITAL_COMMUNITY): Payer: Self-pay

## 2021-10-01 DIAGNOSIS — R1084 Generalized abdominal pain: Secondary | ICD-10-CM | POA: Insufficient documentation

## 2021-10-01 DIAGNOSIS — F1910 Other psychoactive substance abuse, uncomplicated: Secondary | ICD-10-CM | POA: Insufficient documentation

## 2021-10-01 DIAGNOSIS — R7989 Other specified abnormal findings of blood chemistry: Secondary | ICD-10-CM | POA: Insufficient documentation

## 2021-10-01 DIAGNOSIS — R0602 Shortness of breath: Secondary | ICD-10-CM | POA: Insufficient documentation

## 2021-10-01 DIAGNOSIS — E860 Dehydration: Secondary | ICD-10-CM | POA: Insufficient documentation

## 2021-10-01 DIAGNOSIS — R778 Other specified abnormalities of plasma proteins: Secondary | ICD-10-CM | POA: Insufficient documentation

## 2021-10-01 DIAGNOSIS — R0789 Other chest pain: Secondary | ICD-10-CM | POA: Insufficient documentation

## 2021-10-01 DIAGNOSIS — R82998 Other abnormal findings in urine: Secondary | ICD-10-CM | POA: Insufficient documentation

## 2021-10-01 DIAGNOSIS — R112 Nausea with vomiting, unspecified: Secondary | ICD-10-CM | POA: Insufficient documentation

## 2021-10-01 LAB — TROPONIN I (HIGH SENSITIVITY)
Troponin I (High Sensitivity): 27 ng/L — ABNORMAL HIGH (ref <18)
Troponin I (High Sensitivity): 29 ng/L — ABNORMAL HIGH (ref <18)

## 2021-10-01 LAB — CBC WITH DIFFERENTIAL/PLATELET
Abs Immature Granulocytes: 0.06 10*3/uL (ref 0.00–0.07)
Basophils Absolute: 0 10*3/uL (ref 0.0–0.1)
Basophils Relative: 0 %
Eosinophils Absolute: 0 10*3/uL (ref 0.0–0.5)
Eosinophils Relative: 0 %
HCT: 39.9 % (ref 39.0–52.0)
Hemoglobin: 13.4 g/dL (ref 13.0–17.0)
Immature Granulocytes: 0 %
Lymphocytes Relative: 7 %
Lymphs Abs: 1.1 10*3/uL (ref 0.7–4.0)
MCH: 29.5 pg (ref 26.0–34.0)
MCHC: 33.6 g/dL (ref 30.0–36.0)
MCV: 87.9 fL (ref 80.0–100.0)
Monocytes Absolute: 0.8 10*3/uL (ref 0.1–1.0)
Monocytes Relative: 5 %
Neutro Abs: 13.9 10*3/uL — ABNORMAL HIGH (ref 1.7–7.7)
Neutrophils Relative %: 88 %
Platelets: 245 10*3/uL (ref 150–400)
RBC: 4.54 MIL/uL (ref 4.22–5.81)
RDW: 12.5 % (ref 11.5–15.5)
WBC: 15.9 10*3/uL — ABNORMAL HIGH (ref 4.0–10.5)
nRBC: 0.1 % (ref 0.0–0.2)

## 2021-10-01 LAB — COMPREHENSIVE METABOLIC PANEL
ALT: 42 U/L (ref 0–44)
AST: 138 U/L — ABNORMAL HIGH (ref 15–41)
Albumin: 5 g/dL (ref 3.5–5.0)
Alkaline Phosphatase: 87 U/L (ref 38–126)
Anion gap: 15 (ref 5–15)
BUN: 22 mg/dL — ABNORMAL HIGH (ref 6–20)
CO2: 24 mmol/L (ref 22–32)
Calcium: 9 mg/dL (ref 8.9–10.3)
Chloride: 96 mmol/L — ABNORMAL LOW (ref 98–111)
Creatinine, Ser: 1.53 mg/dL — ABNORMAL HIGH (ref 0.61–1.24)
GFR, Estimated: >60 mL/min (ref >60)
Glucose, Bld: 104 mg/dL — ABNORMAL HIGH (ref 70–99)
Potassium: 4.9 mmol/L (ref 3.5–5.1)
Sodium: 135 mmol/L (ref 135–145)
Total Bilirubin: 1.7 mg/dL — ABNORMAL HIGH (ref 0.3–1.2)
Total Protein: 8.4 g/dL — ABNORMAL HIGH (ref 6.5–8.1)

## 2021-10-01 LAB — RAPID URINE DRUG SCREEN, HOSP PERFORMED
Amphetamines: NOT DETECTED
Barbiturates: NOT DETECTED
Benzodiazepines: NOT DETECTED
Cocaine: NOT DETECTED
Opiates: NOT DETECTED
Tetrahydrocannabinol: POSITIVE — AB

## 2021-10-01 LAB — URINALYSIS, ROUTINE W REFLEX MICROSCOPIC
Bilirubin Urine: NEGATIVE
Glucose, UA: NEGATIVE mg/dL
Ketones, ur: 20 mg/dL — AB
Leukocytes,Ua: NEGATIVE
Nitrite: NEGATIVE
Protein, ur: 100 mg/dL — AB
Specific Gravity, Urine: 1.016 (ref 1.005–1.030)
pH: 6 (ref 5.0–8.0)

## 2021-10-01 LAB — CBC
HCT: 39.3 % (ref 39.0–52.0)
Hemoglobin: 13.1 g/dL (ref 13.0–17.0)
MCH: 29.2 pg (ref 26.0–34.0)
MCHC: 33.3 g/dL (ref 30.0–36.0)
MCV: 87.7 fL (ref 80.0–100.0)
Platelets: 237 10*3/uL (ref 150–400)
RBC: 4.48 MIL/uL (ref 4.22–5.81)
RDW: 12.3 % (ref 11.5–15.5)
WBC: 17.8 10*3/uL — ABNORMAL HIGH (ref 4.0–10.5)
nRBC: 0 % (ref 0.0–0.2)

## 2021-10-01 LAB — BASIC METABOLIC PANEL
Anion gap: 15 (ref 5–15)
BUN: 17 mg/dL (ref 6–20)
CO2: 25 mmol/L (ref 22–32)
Calcium: 9.3 mg/dL (ref 8.9–10.3)
Chloride: 97 mmol/L — ABNORMAL LOW (ref 98–111)
Creatinine, Ser: 1.56 mg/dL — ABNORMAL HIGH (ref 0.61–1.24)
GFR, Estimated: >60 mL/min (ref >60)
Glucose, Bld: 78 mg/dL (ref 70–99)
Potassium: 3.9 mmol/L (ref 3.5–5.1)
Sodium: 137 mmol/L (ref 135–145)

## 2021-10-01 LAB — LIPASE, BLOOD: Lipase: 21 U/L (ref 11–51)

## 2021-10-01 MED ORDER — LORAZEPAM 1 MG PO TABS
1.0000 mg | ORAL_TABLET | Freq: Once | ORAL | Status: AC
  Administered 2021-10-01: 1 mg via ORAL
  Filled 2021-10-01: qty 1

## 2021-10-01 MED ORDER — IOHEXOL 300 MG/ML  SOLN
100.0000 mL | Freq: Once | INTRAMUSCULAR | Status: AC | PRN
  Administered 2021-10-01: 100 mL via INTRAVENOUS

## 2021-10-01 MED ORDER — SODIUM CHLORIDE 0.9 % IV BOLUS
1000.0000 mL | Freq: Once | INTRAVENOUS | Status: AC
Start: 2021-10-01 — End: 2021-10-01
  Administered 2021-10-01: 1000 mL via INTRAVENOUS

## 2021-10-01 NOTE — Discharge Instructions (Addendum)
You have been seen today for your complaint of atypical chest pain. Your lab work was reassuring. Your imaging was reassuring and showed no abnormalities. Home care instructions are as follows:  You should rest for the rest of the day.  You should continue to eat and drink as you normally would. Follow up with: Cardiology.  They will call you to schedule an appointment.  If you do not hear from them in the next week, make sure you call to schedule an appointment. Please seek immediate medical care if you develop any of the following symptoms: Your chest pain gets worse. You have a cough that gets worse, or you cough up blood. You have severe pain in your abdomen. You faint. You have sudden, unexplained chest discomfort. You have sudden, unexplained discomfort in your arms, back, neck, or jaw. You have shortness of breath at any time. You suddenly start to sweat, or your skin gets clammy. You feel nausea or you vomit. You suddenly feel lightheaded or dizzy. You have severe weakness, or unexplained weakness or fatigue. Your heart begins to beat quickly, or it feels like it is skipping beats. At this time there does not appear to be the presence of an emergent medical condition, however there is always the potential for conditions to change. Please read and follow the below instructions.  Do not take your medicine if  develop an itchy rash, swelling in your mouth or lips, or difficulty breathing; call 911 and seek immediate emergency medical attention if this occurs.  You may review your lab tests and imaging results in their entirety on your MyChart account.  Please discuss all results of fully with your primary care provider and other specialist at your follow-up visit.  Note: Portions of this text may have been transcribed using voice recognition software. Every effort was made to ensure accuracy; however, inadvertent computerized transcription errors may still be present.

## 2021-10-01 NOTE — ED Provider Notes (Signed)
[facility] Provider Note   CSN: [id number] Arrival date & time: 10/01/21  3329     History  Chief Complaint  Patient presents with   Chest Pain    Rodderick Gingras is a 26 y.o. male.  With a history of anxiety, multiple opioid overdoses who presents to the ED for evaluation of left-sided chest pain.  Patient reports pain started approximately 1 hour prior to arrival with sudden onset.  Localizes pain to the left upper chest with radiation to the back.  Pain is constant, describes it as a pressure, rated an 8 out of 10.  Also reports nausea with multiple bouts of nonbloody vomiting while in route to the ED.  Patient reports intermittent cough productive of clear mucus.  Patient states he has a history of panic attacks, reports having a panic attack shortly after chest pain started.  Has not taken anything for anxiety or his pain today.  Also reports shortness of breath with activity as well as at rest.  States he is not able to take a deep breath secondary to pain. Reports intermittent recreational Percocet usage, last use was Saturday.  Patient states he did not sleep at all last night, states this is typical for him.  Reports pain feels similar to when he had kidney issues in the past.  Denies abdominal pain, numbness, weakness, tingling.   Chest Pain Associated symptoms: nausea, palpitations and vomiting   Associated symptoms: no abdominal pain, no diaphoresis, no fever, no headache, no numbness and no weakness        Home Medications Prior to Admission medications   Medication Sig Start Date End Date Taking? Authorizing Provider  fluticasone (FLONASE) 50 MCG/ACT nasal spray Place 2 sprays into both nostrils daily. 03/02/20   Melynda Ripple, MD  ibuprofen (ADVIL) 600 MG tablet Take 1 tablet (600 mg total) by mouth every 6 (six) hours as needed. 03/02/20   Melynda Ripple, MD  ondansetron (ZOFRAN ODT) 8 MG disintegrating tablet 1/2- 1 tablet q 8 hr prn nausea,  vomiting 03/02/20   Melynda Ripple, MD      Allergies    Patient has no known allergies.    Review of Systems   Review of Systems  Constitutional:  Positive for chills. Negative for diaphoresis and fever.  Cardiovascular:  Positive for chest pain and palpitations.  Gastrointestinal:  Positive for nausea and vomiting. Negative for abdominal pain, constipation and diarrhea.  Neurological:  Negative for weakness, light-headedness, numbness and headaches.    Physical Exam Updated Vital Signs Temp 97.9 F (36.6 C)   Resp 20   Ht '5\' 9"'$  (1.753 m)   Wt 83.9 kg   BMI 27.32 kg/m  Physical Exam Vitals and nursing note reviewed.  Constitutional:      General: He is not in acute distress.    Appearance: He is well-developed. He is not ill-appearing or diaphoretic.  HENT:     Head: Normocephalic and atraumatic.  Eyes:     Extraocular Movements: Extraocular movements intact.     Conjunctiva/sclera: Conjunctivae normal.     Comments: Pupils constricted bilaterally  Neck:     Vascular: No JVD.  Cardiovascular:     Rate and Rhythm: Normal rate and regular rhythm.     Pulses:          Radial pulses are 2+ on the right side and 2+ on the left side.       Dorsalis pedis pulses are 2+ on the right side and 2+  on the left side.     Heart sounds: Normal heart sounds. No murmur heard. Pulmonary:     Effort: Pulmonary effort is normal. No respiratory distress.     Breath sounds: Normal breath sounds.  Abdominal:     Palpations: Abdomen is soft.     Tenderness: There is no abdominal tenderness.  Musculoskeletal:        General: No swelling.     Cervical back: Neck supple.     Right lower leg: No edema.     Left lower leg: No edema.  Skin:    General: Skin is warm and dry.     Capillary Refill: Capillary refill takes less than 2 seconds.  Neurological:     Mental Status: He is alert.  Psychiatric:        Mood and Affect: Mood normal.     ED Results / Procedures / Treatments    Labs (all labs ordered are listed, but only abnormal results are displayed) Labs Reviewed  BASIC METABOLIC PANEL  CBC  URINALYSIS, ROUTINE W REFLEX MICROSCOPIC  RAPID URINE DRUG SCREEN, HOSP PERFORMED  TROPONIN I (HIGH SENSITIVITY)    EKG None  Radiology No results found.  Procedures Procedures    Medications Ordered in ED Medications - No data to display  ED Course/ Medical Decision Making/ A&P Clinical Course as of 10/01/21 1339  Mon Oct 01, 2021  1018 DG Chest [place] 1 View I personally reviewed and interpreted the image.  No acute cardiopulmonary abnormality. [AS]  1696 Upon reevaluation, patient is resting comfortably in bed and sleeping.  When awoken, patient states that chest pain is nearly completely resolved. [AS]  7893 Troponin I (High Sensitivity)(!) Troponin elevated at 27.  Patient has a history of elevated troponins, most recently on 02/24/2021 with an initial troponin of 93. [AS]  8101 Upon reevaluation, patient states that chest pain is now completely resolved, has no shortness of breath.  He states he feels relieved. [AS]    Clinical Course User Index [AS] Roylene Reason, PA-C   This patient presents to the ED for concern of chest pain, this involves an extensive number of treatment options, and is a complaint that carries with it a high risk of complications and morbidity. The emergent differential diagnosis of chest pain includes: Acute coronary syndrome, pericarditis, aortic dissection, pulmonary embolism, tension pneumothorax, and esophageal rupture.  I do not believe the patient has an emergent cause of chest pain, other urgent/non-acute considerations include, but are not limited to: chronic angina, aortic stenosis, cardiomyopathy, myocarditis, mitral valve prolapse, pulmonary hypertension, hypertrophic obstructive cardiomyopathy (HOCM), aortic insufficiency, right ventricular hypertrophy, pneumonia, pleuritis, bronchitis, pneumothorax, tumor,  gastroesophageal reflux disease (GERD), esophageal spasm, Mallory-Weiss syndrome, peptic ulcer disease, biliary disease, pancreatitis, functional gastrointestinal pain, cervical or thoracic disk disease or arthritis, shoulder arthritis, costochondritis, subacromial bursitis, anxiety or panic attack, herpes zoster, breast disorders, chest wall tumors, thoracic outlet syndrome, mediastinitis.     Co morbidities that complicate the patient evaluation  Anxiety, recreational opioid use.  My initial workup includes ACS rule out, Ativan  Additional history obtained from: Nursing notes from this visit. Prior ED visit on 02/24/2021 Previous records within EMR system showing multiple opioid overdoses in the past 3 years. Family mother and stepfather present and provide a portion of the history  I ordered, reviewed and interpreted labs which include: CBC, BMP, troponin.  Creatinine slightly elevated at 1.56. initial troponin 27, delta troponin 29.   I ordered imaging studies including chest x-ray  I independently visualized and interpreted imaging which showed no acute cardiopulmonary abnormality I agree with the radiologist interpretation  Cardiac Monitoring:  The patient was maintained on a cardiac monitor.  I personally viewed and interpreted the cardiac monitored which showed an underlying rhythm of: NSR  Afebrile, hemodynamically stable.  Patient has been hemodynamically stable throughout evaluation.  Patient received 1 mg p.o. Ativan for symptomatic relief.  States that she after initial reevaluation patient states that chest pain had nearly completely resolved.  Subsequent reevaluation shows chest pain has reached complete resolution.  Patient reported he has not slept since Friday due to life stressors.  Atypical presentation of chest pain, possibly caused by anxiety secondary to sleep deprivation.  Heart score 3, low suspicion for ACS.  Troponins are not concerning, laboratory radiographic and  noticed that patient was seen at [facility] on 02/24/2021 for evaluation of an opioid overdose.  Troponins at that time showed an initial value of 93, subsequent value of 87.  This is much higher than values from today's visit.  I have placed an urgent ambulatory cardiology referral for further work-up.  Patient was given strict return precautions.  Stable at time of discharge.   At this time there does not appear to be any evidence of an acute emergency medical condition and the patient appears stable for discharge with appropriate outpatient follow up. Diagnosis was discussed with patient who verbalizes understanding of care plan and is agreeable to discharge. I have discussed return precautions with patient and mother who verbalizes understanding. Patient encouraged to follow-up with their PCP within 1 week.  I ordered a cardiology referral inform patient that if he does not hear within the next 7 days that he should call to schedule an appointment.  All questions answered.  Patient's case discussed with Dr. Sabra Heck who agrees with plan to discharge with follow-up.   Note: Portions of this report may have been transcribed using voice recognition software. Every effort was made to ensure accuracy; however, inadvertent computerized transcription errors may still be present.                         Medical Decision Making Amount and/or Complexity of Data Reviewed Labs: ordered. Radiology: ordered.          Final Clinical Impression(s) / ED Diagnoses Final diagnoses:  None    Rx / DC Orders ED Discharge Orders     None         Nehemiah Massed 10/01/21 1353    Noemi Chapel, MD 10/02/21 [phone]

## 2021-10-01 NOTE — ED Triage Notes (Signed)
Patient reports chest pain with chills, SHOB for the past hour. State that he has been vomiting this am. Mom reports anxiety attack before coming to ED.
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
## 2021-10-01 NOTE — ED Provider Notes (Signed)
[facility] EMERGENCY DEPARTMENT Provider Note   CSN: [id number] Arrival date & time: 10/01/21  1718     History  Chief Complaint  Patient presents with   Abdominal Pain        Altered Mental Status    Possible OD?    Andrew Waller is a 26 y.o. male.  Pt is a 26 yo male with a pmhx significant for opiate abuse and anxiety.  Pt was here earlier for cp.  Labs and EKG and CXR ok, so he was d/c home.  Mom said the sheriff was called because pt was outside on the street in just his boxers.  He was rubbing mud on cars on the side of the road.  He was ringing people's doors.  Pt said he did buy percocet, but it was from a dealer, it was not a rx.  Pt has had problems with drug abuse in the past.  The sheriff told his mom to bring him here so we could send the patient to a drug treatment program.  Pt has been to rehab several times.  Pt told the nurse he has abd pain.  The pt is not sure when it started.  No vomiting or diarrhea.       Home Medications Prior to Admission medications   Not on File      Allergies    Patient has no known allergies.    Review of Systems   Review of Systems  Gastrointestinal:  Positive for abdominal pain.  All other systems reviewed and are negative.   Physical Exam Updated Vital Signs BP (!) 133/96   Pulse 100   Temp 98 F (36.7 C) (Oral)   Resp 19   SpO2 99%  Physical Exam Vitals and nursing note reviewed.  Constitutional:      Appearance: He is well-developed.  HENT:     Head: Normocephalic and atraumatic.     Mouth/Throat:     Mouth: Mucous membranes are moist.     Pharynx: Oropharynx is clear.  Eyes:     Extraocular Movements: Extraocular movements intact.     Pupils: Pupils are equal, round, and reactive to light.  Cardiovascular:     Rate and Rhythm: Normal rate and regular rhythm.     Heart sounds: Normal heart sounds.  Pulmonary:     Effort: Pulmonary effort is normal.     Breath sounds: Normal breath sounds.   Abdominal:     General: Abdomen is flat. Bowel sounds are normal.     Palpations: Abdomen is soft.     Tenderness: There is generalized abdominal tenderness.  Skin:    General: Skin is warm.     Capillary Refill: Capillary refill takes less than 2 seconds.  Neurological:     General: No focal deficit present.     Mental Status: He is alert and oriented to person, place, and time.  Psychiatric:        Mood and Affect: Mood normal.        Behavior: Behavior normal.     ED Results / Procedures / Treatments   Labs (all labs ordered are listed, but only abnormal results are displayed) Labs Reviewed  CBC WITH DIFFERENTIAL/PLATELET - Abnormal; Notable for the following components:      Result Value   WBC 15.9 (*)    Neutro Abs 13.9 (*)    All other components within normal limits  COMPREHENSIVE METABOLIC PANEL - Abnormal; Notable for the following components:  Chloride 96 (*)    Glucose, Bld 104 (*)    BUN 22 (*)    Creatinine, Ser 1.53 (*)    Total Protein 8.4 (*)    AST 138 (*)    Total Bilirubin 1.7 (*)    All other components within normal limits  URINALYSIS, ROUTINE W REFLEX MICROSCOPIC - Abnormal; Notable for the following components:   Hgb urine dipstick MODERATE (*)    Ketones, ur 20 (*)    Protein, ur 100 (*)    Bacteria, UA RARE (*)    All other components within normal limits  RAPID URINE DRUG SCREEN, HOSP PERFORMED - Abnormal; Notable for the following components:   Tetrahydrocannabinol POSITIVE (*)    All other components within normal limits  LIPASE, BLOOD    EKG None  Radiology CT ABDOMEN PELVIS W CONTRAST  Result Date: 10/01/2021 CLINICAL DATA:  Acute abdominal pain. EXAM: CT ABDOMEN AND PELVIS WITH CONTRAST TECHNIQUE: Multidetector CT imaging of the abdomen and pelvis was performed using the standard protocol following bolus administration of intravenous contrast. RADIATION DOSE REDUCTION: This exam was performed according to the departmental  dose-optimization program which includes automated exposure control, adjustment of the mA and/or kV according to patient size and/or use of iterative reconstruction technique. CONTRAST:  169m OMNIPAQUE IOHEXOL 300 MG/ML  SOLN COMPARISON:  None Available. FINDINGS: Lower chest: No acute abnormality. Hepatobiliary: No focal liver abnormality is seen. No gallstones, gallbladder wall thickening, or biliary dilatation. Pancreas: Unremarkable. No pancreatic ductal dilatation or surrounding inflammatory changes. Spleen: Normal in size without focal abnormality. Adrenals/Urinary Tract: Adrenal glands are unremarkable. Kidneys are normal, without renal calculi, focal lesion, or hydronephrosis. Bladder is unremarkable. Stomach/Bowel: Stomach is within normal limits. Appendix appears normal. No evidence of bowel wall thickening, distention, or inflammatory changes. Vascular/Lymphatic: No significant vascular findings are present. No enlarged abdominal or pelvic lymph nodes. Reproductive: Prostate is unremarkable. Other: No abdominal wall hernia or abnormality. No abdominopelvic ascites. Musculoskeletal: No acute or significant osseous findings. IMPRESSION: No acute process in the abdomen or pelvis. Electronically Signed   By: ARonney AstersM.D.   On: 10/01/2021 19:56   DG Chest Port 1 View  Result Date: 10/01/2021 CLINICAL DATA:  Chest pain EXAM: PORTABLE CHEST 1 VIEW COMPARISON:  February 24, 2021 FINDINGS: The heart size and mediastinal contours are within normal limits. Both lungs are clear. The visualized skeletal structures are unremarkable. IMPRESSION: No active disease. Electronically Signed   By: DDorise BullionIII M.D.   On: 10/01/2021 09:59    Procedures Procedures    Medications Ordered in ED Medications  sodium chloride 0.9 % bolus 1,000 mL (0 mLs Intravenous Stopped 10/01/21 2037)  iohexol (OMNIPAQUE) 300 MG/ML solution 100 mL (100 mLs Intravenous Contrast Given 10/01/21 1950)    ED Course/ Medical  Decision Making/ A&P                           Medical Decision Making Amount and/or Complexity of Data Reviewed Labs: ordered. Radiology: ordered.  Risk Prescription drug management.   This patient presents to the ED for concern of abd pain, this involves an extensive number of treatment options, and is a complaint that carries with it a high risk of complications and morbidity.  The differential diagnosis includes infection, electrolyte abn, drug use, constipation   Co morbidities that complicate the patient evaluation  Opiate abuse   Additional history obtained:  Additional history obtained from epic chart review External  records from outside source obtained and reviewed including mom   Lab Tests:  I Ordered, and personally interpreted labs.  The pertinent results include:  cbc with wbc 15.9; ua + ketones, uds + mj, cmp nl, lip nl   Imaging Studies ordered:  I ordered imaging studies including ct abd/pelvis  I independently visualized and interpreted imaging which showed  IMPRESSION:  No acute process in the abdomen or pelvis.   I agree with the radiologist interpretation   Cardiac Monitoring:  The patient was maintained on a cardiac monitor.  I personally viewed and interpreted the cardiac monitored which showed an underlying rhythm of: nsr   Medicines ordered and prescription drug management:  I ordered medication including ivfs  for dehydration  Reevaluation of the patient after these medicines showed that the patient improved I have reviewed the patients home medicines and have made adjustments as needed   Test Considered:  ct   Critical Interventions:  ivfs   Problem List / ED Course:  Abd pain:  wbc slightly elevated, but labs otherwise ok.  Ct neg.  Pain gone now. Polysubstance abuse:  pt is not suicidal or homicidal.  No indication for IVC.  His MS is back to nl.  Mom given a list of inpatient treatment programs.  Pt needs to want to quit.   Mom feels comfortable taking him home.  Pt is stable for d/c.  Return if worse.    Reevaluation:  After the interventions noted above, I reevaluated the patient and found that they have :improved   Social Determinants of Health:  Lives at home.  Self pay.   Dispostion:  After consideration of the diagnostic results and the patients response to treatment, I feel that the patent would benefit from discharge with outpatient f/u.          Final Clinical Impression(s) / ED Diagnoses Final diagnoses:  Generalized abdominal pain  Polysubstance abuse [facility])    Rx / DC Orders ED Discharge Orders     None         Isla Pence, MD 10/01/21 2105

## 2021-10-01 NOTE — ED Notes (Signed)
As this nurse was exiting room 11, this pt was standing at the automatic doors trying to leave. This nurse asked the pt where he was going. The pt stated that he was leaving. This nurse stated that he needed to return to his room so that I could remove his IV. Pt became aggressive and started pulling at his IV stating that he would take it out himself. This nurse stated that he needed to go to his room now and that I would be there very shortly with discharge papers and to remove his IV. The pt was then very aggressive yelling and slammed room 12 door as he reentered his room. This nurse requested security to come due to the volatile and unpredictable state that the pt was currently in. While awaiting security to come, but to expedite getting this pt discharged this nurse asked a nurse tech to come witness just incase the pt became violent. When this nurse and NT entered the room the pt had already self removed his IV with several drips of blood on the floor. Bleeding was stopped, bandaged and pt was discharged.

## 2023-12-17 ENCOUNTER — Ambulatory Visit (HOSPITAL_COMMUNITY): Admission: EM | Admit: 2023-12-17 | Discharge: 2023-12-17 | Disposition: A | Discharge status: Home / Self Care

## 2023-12-17 DIAGNOSIS — F1121 Opioid dependence, in remission: Secondary | ICD-10-CM | POA: Diagnosis not present

## 2023-12-17 NOTE — ED Provider Notes (Signed)
 Behavioral Health Urgent Care Medical Screening Exam  Patient Name: Andrew Waller MRN: [id number] Date of Evaluation: 12/17/23 Chief Complaint:  I want Suboxone. Diagnosis:  Final diagnoses:  Opioid dependence in remission [facility])    History of Present illness: Andrew Waller is a 28 y.o. male. Pt came here to get Suboxone. He has a history of substance use problems. He was just released from a 90 day program on Nov 4 and relapsed on heroine and fentanyl 3 days ago. He says that his longest period of sobriety was 1 year when he took Suboxone.  He reports a 6 year history of heroine and fentanyl dependence. His girlfriend brought him here today due to he was craving this morning.   He reports a history of depression and 1 psych admission in 2019. He took Lexapro  but did not like how it made him feel. He took it for 1-2 months. He denies SI/HI/AH/VH. Mood is anxious. He bites his nails and feels constantly anxious. He sleeps 6 hrs nightly and has low energy during the day at times. His appetite he reports as fair. His focus is poor and he states he has a one track mind and that his craving the opioids. He denies a history of trauma or being a victim of abuse. He reports drug induced psychosis in the past. He has done 4 drug rehabilitation treatments. The last program 90 days was court ordered due to a DWI charge. Pt is on probation.   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 He started alcohol and THC use at age 75. He has used other substances but his drug of choice is opioids. He lives with his mother and stepfather and he has 5 children that visit. There is a family history of anxiety in mother.  Pt is calm and has normal speech. He is appropriately groomed and dressed. There are no negative or positive symptoms of psychosis.   Flowsheet Row ED from 12/17/2023 in [facility] Most recent reading at 12/17/2023 11:23 AM ED from 10/01/2021 in [facility]  Emergency Department at [facility] Most recent reading at 10/01/2021  5:29 PM ED from 10/01/2021 in [facility] Emergency Department at [facility] Most recent reading at 10/01/2021  9:34 AM  C-SSRS RISK CATEGORY No Risk No Risk No Risk    Psychiatric Specialty Exam  Presentation  General Appearance:Appropriate for Environment  Eye Contact:Good  Speech:Clear and Coherent  Speech Volume:Normal  Handedness:-- (not obtained)   Mood and Affect  Mood:Euthymic  Affect:Appropriate   Thought Process  Thought Processes:Coherent  Descriptions of Associations:Intact  Orientation:Full (Time, Place and Person)  Thought Content:Logical    Hallucinations:None  Ideas of Reference:None  Suicidal Thoughts:No  Homicidal Thoughts:No   Sensorium  Memory:Immediate Good; Recent Good; Remote Good  Judgment:Good  Insight:Good   Executive Functions  Concentration:Good  Attention Span:Good  Recall:Good  Fund of Knowledge:Good  Language:Good   Psychomotor Activity  Psychomotor Activity:Normal   Assets  Assets:Communication Skills; Desire for Improvement; Housing; Social Support; Talents/Skills   Sleep  Sleep:Fair  Number of hours: 6   Physical Exam: Physical Exam Vitals reviewed.  Constitutional:      Appearance: Normal appearance. He is normal weight.  HENT:     Head: Normocephalic and atraumatic.     Mouth/Throat:     Pharynx: Oropharynx is clear.  Eyes:     Conjunctiva/sclera: Conjunctivae normal.  Pulmonary:     Effort: Pulmonary effort is normal.  Musculoskeletal:        General: Normal range of motion.     Cervical back: Normal range of motion.  Skin:    General: Skin is dry.  Neurological:     General: No focal deficit present.     Mental Status: He is alert and oriented to person, place, and time.    Review of Systems  Constitutional:  Positive for malaise/fatigue. Negative for chills, fever and weight loss.  HENT:   Negative for hearing loss.   Eyes:  Negative for blurred vision and double vision.  Respiratory:  Negative for cough and hemoptysis.   Cardiovascular:  Negative for chest pain and palpitations.  Gastrointestinal:  Negative for heartburn and nausea.  Genitourinary:  Negative for dysuria.  Musculoskeletal:  Negative for myalgias and neck pain.  Skin:  Negative for rash.  Neurological:  Negative for dizziness, tingling, tremors and headaches.  Endo/Heme/Allergies:  Does not bruise/bleed easily.  Psychiatric/Behavioral:  Positive for substance abuse. Negative for depression, hallucinations, memory loss and suicidal ideas. The patient is nervous/anxious. The patient does not have insomnia.    Blood pressure 132/88, pulse 98, temperature 98.6 F (37 C), temperature source Oral, SpO2 99%. There is no height or weight on file to calculate BMI.  Musculoskeletal: Strength & Muscle Tone: within normal limits Gait & Station: normal Patient leans: N/A   [facility] MSE Discharge Disposition for Follow up and Recommendations: Based on my evaluation the patient does not appear to have an emergency medical condition and can be discharged with resources and follow up care in outpatient services for Suboxone clinic Untreated anxiety may be worth treating as this may contribute to addiction. Pt was referred to Suboxone clinics.    Garvin JINNY Gaines, MD 12/17/2023, 1:06 PM

## 2023-12-17 NOTE — Discharge Instructions (Addendum)
 Based on the information that you have provided and the presenting issues outpatient services and resources for have been recommended.  It is imperative that you follow through with treatment recommendations within 5-7 days from the of discharge to mitigate further risk to your safety and mental well-being. A list of referrals has been provided below to get you started.  You are not limited to the list provided.  In case of an urgent crisis, you may contact the Mobile Crisis Unit with [facility] at [phone].   Medication Assistance Treatment (MAT)  Clinics in [facility]   [facility] [address] , [place], [place] [address] [phone]  [facility] [address], [place] [address] [phone]    [facility]  [address], [place], [place] [address] [phone]  [facility] [address], [place], [place] [address] [phone]   [facility] [address] [place] [phone]  [facility]  [address], [place], [place] [address] [phone]  [facility] [address] [place] [address]  [phone]

## 2023-12-17 NOTE — Progress Notes (Signed)
   12/17/23 1120  [facility] Triage Screening (Walk-ins at [facility] only)  What Is the Reason for Your Visit/Call Today? State is a 28 year old male presenting to [facility] accompanied by his wife. Pt states he is looking to be put on Subboxone for his heroin addiction. Pt states that he has been using Heroin for 6 years and is actively using every 2-3 days. Pt states he is not looking for inpatient, but simply looking for medication to help his ongoing addiction. Pt denies seeing a therapist at this time. Pt also denies alcohol use, Si, Hi and AVH. Pt reports his last use was 3 days ago (Heroin).  How Long Has This Been Causing You Problems? > than 6 months  Have You Recently Had Any Thoughts About Hurting Yourself? No  Are You Planning to Commit Suicide/Harm Yourself At This time? No  Have you Recently Had Thoughts About Hurting Someone Sherral? No  Are You Planning To Harm Someone At This Time? No  Exploitation of patient/patient's resources Denies  Self-Neglect Denies  Possible abuse reported to: Other (Comment)  Are you currently experiencing any auditory, visual or other hallucinations? No  Have You Used Any Alcohol or Drugs in the Past 24 Hours? No  Do you have any current medical co-morbidities that require immediate attention? No  What Do You Feel Would Help You the Most Today? Medication(s)  If access to [facility] was not available, would you have sought care in the Emergency Department? No  Determination of Need Routine (7 days)  Options For Referral Medication Management
